# Patient Record
Sex: Male | Born: 1955
Health system: Southern US, Community
[De-identification: ages and names within clinical notes are randomized; demographics above are authoritative.]

## PROBLEM LIST (undated history)

## (undated) ENCOUNTER — Emergency Department (HOSPITAL_COMMUNITY): Discharge status: Absent without leave | Payer: Self-pay

## (undated) DIAGNOSIS — M1990 Unspecified osteoarthritis, unspecified site: Secondary | ICD-10-CM

## (undated) DIAGNOSIS — M2550 Pain in unspecified joint: Secondary | ICD-10-CM

## (undated) HISTORY — PX: JOINT REPLACEMENT: SHX530

---

## 2009-06-23 ENCOUNTER — Emergency Department (HOSPITAL_COMMUNITY): Admission: EM | Admit: 2009-06-23 | Discharge: 2009-06-23 | Payer: Self-pay | Admitting: Emergency Medicine

## 2009-06-25 ENCOUNTER — Emergency Department (HOSPITAL_COMMUNITY): Admission: EM | Admit: 2009-06-25 | Discharge: 2009-06-25 | Payer: Self-pay | Admitting: Emergency Medicine

## 2014-09-21 ENCOUNTER — Emergency Department (HOSPITAL_COMMUNITY)
Admission: EM | Admit: 2014-09-21 | Discharge: 2014-09-22 | Disposition: A | Discharge status: Home / Self Care | Payer: Managed Care, Other (non HMO) | Attending: Emergency Medicine | Admitting: Emergency Medicine

## 2014-09-21 ENCOUNTER — Emergency Department (HOSPITAL_COMMUNITY): Payer: Managed Care, Other (non HMO)

## 2014-09-21 ENCOUNTER — Encounter (HOSPITAL_COMMUNITY): Payer: Self-pay | Admitting: Emergency Medicine

## 2014-09-21 DIAGNOSIS — W1839XA Other fall on same level, initial encounter: Secondary | ICD-10-CM | POA: Diagnosis not present

## 2014-09-21 DIAGNOSIS — M87052 Idiopathic aseptic necrosis of left femur: Secondary | ICD-10-CM | POA: Insufficient documentation

## 2014-09-21 DIAGNOSIS — Y92009 Unspecified place in unspecified non-institutional (private) residence as the place of occurrence of the external cause: Secondary | ICD-10-CM | POA: Insufficient documentation

## 2014-09-21 DIAGNOSIS — S79912A Unspecified injury of left hip, initial encounter: Secondary | ICD-10-CM | POA: Diagnosis present

## 2014-09-21 DIAGNOSIS — Y999 Unspecified external cause status: Secondary | ICD-10-CM | POA: Diagnosis not present

## 2014-09-21 DIAGNOSIS — Z720 Tobacco use: Secondary | ICD-10-CM | POA: Diagnosis not present

## 2014-09-21 DIAGNOSIS — Y939 Activity, unspecified: Secondary | ICD-10-CM | POA: Insufficient documentation

## 2014-09-21 DIAGNOSIS — W19XXXA Unspecified fall, initial encounter: Secondary | ICD-10-CM

## 2014-09-21 DIAGNOSIS — S72052A Unspecified fracture of head of left femur, initial encounter for closed fracture: Secondary | ICD-10-CM | POA: Diagnosis not present

## 2014-09-21 DIAGNOSIS — R52 Pain, unspecified: Secondary | ICD-10-CM

## 2014-09-21 LAB — BASIC METABOLIC PANEL
Anion gap: 14 (ref 5–15)
BUN: 8 mg/dL (ref 6–20)
CALCIUM: 8.9 mg/dL (ref 8.9–10.3)
CO2: 23 mmol/L (ref 22–32)
Chloride: 100 mmol/L — ABNORMAL LOW (ref 101–111)
Creatinine, Ser: 1.03 mg/dL (ref 0.61–1.24)
GFR calc Af Amer: >60 mL/min (ref >60)
GFR calc non Af Amer: >60 mL/min (ref >60)
GLUCOSE: 103 mg/dL — AB (ref 70–99)
Potassium: 4.2 mmol/L (ref 3.5–5.1)
Sodium: 137 mmol/L (ref 135–145)

## 2014-09-21 LAB — CBC WITH DIFFERENTIAL/PLATELET
BASOS ABS: 0 10*3/uL (ref 0.0–0.1)
Basophils Relative: 0 % (ref 0–1)
EOS PCT: 2 % (ref 0–5)
Eosinophils Absolute: 0.1 10*3/uL (ref 0.0–0.7)
HEMATOCRIT: 42.5 % (ref 39.0–52.0)
Hemoglobin: 14.7 g/dL (ref 13.0–17.0)
LYMPHS PCT: 62 % — AB (ref 12–46)
Lymphs Abs: 3.3 10*3/uL (ref 0.7–4.0)
MCH: 33.4 pg (ref 26.0–34.0)
MCHC: 34.6 g/dL (ref 30.0–36.0)
MCV: 96.6 fL (ref 78.0–100.0)
MONO ABS: 0.6 10*3/uL (ref 0.1–1.0)
Monocytes Relative: 11 % (ref 3–12)
Neutro Abs: 1.4 10*3/uL — ABNORMAL LOW (ref 1.7–7.7)
Neutrophils Relative %: 25 % — ABNORMAL LOW (ref 43–77)
Platelets: 275 10*3/uL (ref 150–400)
RBC: 4.4 MIL/uL (ref 4.22–5.81)
RDW: 13.2 % (ref 11.5–15.5)
WBC: 5.4 10*3/uL (ref 4.0–10.5)

## 2014-09-21 MED ORDER — OXYCODONE-ACETAMINOPHEN 5-325 MG PO TABS: 1.0000 | ORAL_TABLET | Freq: Four times a day (QID) | ORAL | Status: DC | PRN

## 2014-09-21 NOTE — ED Provider Notes (Signed)
Medical screening examination/treatment/procedure(s) were conducted as a shared visit with non-physician practitioner(s) and myself.  I personally evaluated the patient during the encounter.   EKG Interpretation None      No results found for this or any previous visit. Ct Hip Left Wo Contrast  09/21/2014   CLINICAL DATA:  Patient fell at home.  Left hip pain.  EXAM: CT OF THE LEFT HIP WITHOUT CONTRAST  TECHNIQUE: Multidetector CT imaging of the left hip was performed according to the standard protocol. Multiplanar CT image reconstructions were also generated.  COMPARISON:  None.  FINDINGS: There is geographic sclerosis and lucency along the superior and lateral aspect of the left femoral head with cortical depression demonstrated. The appearance is most consistent with avascular necrosis and small associated impaction fracture. Mild degenerative narrowing of the superior acetabular joint with small osteophytes on the acetabular and femoral surfaces. No acute displaced fractures are identified. Visualized portion of the left hemipelvis is intact. No significant effusion. No soft tissue hematomas. Vascular calcifications. Prostate gland appears enlarged.  IMPRESSION: Described abnormalities in the left femoral head most consistent with avascular necrosis and associated impaction fracture.   Electronically Signed   By: Lucienne Capers M.D.   On: 09/21/2014 22:21    Patient seen by me in fast track. Patient with leg giving out left hip last evening with a fall no other injuries. CT showing avascular necrosis probably pre-existing and now a left femoral head impaction fracture. We'll discuss with orthopedics will most likely require admission. Admission the meds will be done. Also usually admitted by internal medicine. Patient the able to ambulate some has pain in left hip. No swelling to the leg no numbness to the foot on the left side.  Fredia Sorrow, MD 09/21/14 2241

## 2014-09-21 NOTE — ED Notes (Signed)
Pt. fell at home at last " legs gave out" , reports pain at left hip , ambulatory , pain increases with movement/ certain positions.

## 2014-09-21 NOTE — ED Provider Notes (Signed)
CSN: [id number]     Arrival date & time 09/21/14  2026 History  This chart was scribed for non-physician practitioner, [name], NP, working with Seth Sorrow, MD, by Jeanell Sparrow, ED Scribe. This patient was seen in room TR06C/TR06C and the patient's care was started at 9:09 PM.   Chief Complaint  Patient presents with  . Fall  . Hip Pain   Patient is a 59 y.o. male presenting with fall and hip pain. The history is provided by the patient. No language interpreter was used.  Fall This is a new problem. The current episode started 3 to 5 hours ago. The problem occurs rarely. The problem has not changed since onset.The symptoms are aggravated by walking. Nothing relieves the symptoms. He has tried nothing for the symptoms.  Hip Pain   HPI Comments: Seth Bell is a 59 y.o. male who presents to the Emergency Department complaining of a fall that occurred today. He reports that his LE gave out today and he ended up landing on his knee. He denies any LOC. He states that he had a sudden onset of left hip during and after the fall. He reports that bearing weight on his LE exacerbates the hip pain. He states that he injured his hip 6 months ago when he fell in the bath tub.   History reviewed. No pertinent past medical history. History reviewed. No pertinent past surgical history. No family history on file. History  Substance Use Topics  . Smoking status: Current Every Day Smoker  . Smokeless tobacco: Not on file  . Alcohol Use: Yes    Review of Systems  Musculoskeletal: Positive for myalgias.  Neurological: Negative for syncope.  All other systems reviewed and are negative.    Allergies  Review of patient's allergies indicates no known allergies.  Home Medications   Prior to Admission medications   Not on File   BP 122/80 mmHg  Pulse 91  Temp(Src) 98 F (36.7 C) (Oral)  Resp 12  Ht '5\' 11"'$  (1.803 m)  Wt 150 lb (68.04 kg)  BMI 20.93 kg/m2  SpO2 98% Physical Exam   Constitutional: He appears well-developed and well-nourished. No distress.  HENT:  Head: Normocephalic and atraumatic.  Neck: Neck supple. No tracheal deviation present.  Cardiovascular: Normal rate, regular rhythm and normal heart sounds.  Exam reveals no gallop and no friction rub.   No murmur heard. Pulmonary/Chest: Effort normal. No respiratory distress. He has no wheezes. He has no rales.  Musculoskeletal:  TTP along left hip in the vicinity of the femoral head. Remote hx of a fall.   Nursing note and vitals reviewed.   ED Course  Procedures (including critical care time) DIAGNOSTIC STUDIES: Oxygen Saturation is 98% on RA, normal by my interpretation.    COORDINATION OF CARE: 9:13 PM- Pt advised of plan for treatment which includes radiology and pt agrees.  Labs Review Labs Reviewed - No data to display  Imaging Review Ct Hip Left Wo Contrast  09/21/2014   CLINICAL DATA:  Patient fell at home.  Left hip pain.  EXAM: CT OF THE LEFT HIP WITHOUT CONTRAST  TECHNIQUE: Multidetector CT imaging of the left hip was performed according to the standard protocol. Multiplanar CT image reconstructions were also generated.  COMPARISON:  None.  FINDINGS: There is geographic sclerosis and lucency along the superior and lateral aspect of the left femoral head with cortical depression demonstrated. The appearance is most consistent with avascular necrosis and small associated impaction fracture. Mild  degenerative narrowing of the superior acetabular joint with small osteophytes on the acetabular and femoral surfaces. No acute displaced fractures are identified. Visualized portion of the left hemipelvis is intact. No significant effusion. No soft tissue hematomas. Vascular calcifications. Prostate gland appears enlarged.  IMPRESSION: Described abnormalities in the left femoral head most consistent with avascular necrosis and associated impaction fracture.   Electronically Signed   By: Lucienne Capers  M.D.   On: 09/21/2014 22:21     EKG Interpretation None     Discussed patient with Dr. Percell Miller, orthopedics--he has reviewed the CT scan, and recommends discharging the patient home with touch-down weight bearing.  He will see the patient in the office on Wednesday morning to evaluate the patient and discuss options for care.  Patient agreeable with plan.  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   I personally performed the services described in this documentation, which was scribed in my presence. The recorded information has been reviewed and is accurate.     [name], NP 09/22/14 0140

## 2014-09-21 NOTE — Discharge Instructions (Signed)
Avascular Necrosis Avascular necrosis is a disease resulting from the temporary or permanent loss of the blood supply to the bones. Without blood, the bone tissue dies and causes the bone to become soft. If the process involves the bone near a joint, it may lead to collapse of the joint surface. This disease is also known as:  Osteonecrosis.  Aseptic necrosis.  Ischemic bone necrosis. Avascular necrosis most commonly affects the ends (epiphysis) of long bones. The femur, the bone extending from the knee joint to the hip joint, is the bone most commonly involved. The disease may affect 1 bone, more than 1 bone at the same time, more than 1 bone at different times. It affects men and women equally. Avascular necrosis occurs at any age. But it is more common between the ages of 51 and 10 years. SYMPTOMS  In early stages patients may not have any symptoms. But as the disease progresses, joint pain generally develops. At first there is pain when putting weight on the affected joint, and then when resting. Pain usually develops gradually. It may be mild or severe. As the disease progresses and the bone and surrounding joint surface collapses, pain may develop or increase dramatically. Pain may be severe enough to limit range of motion in the affected joint. The period of time between the first symptoms and loss of joint function is different for each patient. This can range from several months to more than a year. Disability depends on: 1. What part of the bone is affected. 2. How large an area is involved. 3. How effectively the bone repairs itself. 4. If other illnesses are present. 5. If you are being treated for cancer with medications (chemotherapy). 6. Radiation. 7. The cause of the avascular necrosis. DIAGNOSIS  The diagnosis of aseptic necrosis is usually made by: 1. Taking a history. 2. Doing an exam. 3. Taking X-rays. (If X-rays are normal, an MRI may be required.) 4. Sometimes further  blood work and specialized studies may be necessary. TREATMENT  Treatment for this disease is necessary to maintain joint function. If untreated, most patients will suffer severe pain and limitation in movement within 2 years. Several treatments are available that help prevent further bone and joint damage. They can also reduce pain. To determine the most appropriate treatment, the caregiver considers the following aspects of a patient's disease: 1. The age of the patient. 2. The stage of the disease (early or late). 3. The location and amount of bone affected. It may be a small or large area. 4. The underlying cause of avascular necrosis. The goals in treatment are to: 1. Improve the patient's use of the affected joint. 2. Stop further damage to the bone. 3. Improve bone and joint survival. Your caregiver may use one or more of the following treatments: 1. Reduced weight bearing. If avascular necrosis is diagnosed early, the caregiver may begin treatment by having the patient limit weight on the affected joint. The caregiver may recommend limiting activities or using crutches. In some cases, reduced weight bearing can slow the damage caused by the disease and permit natural healing. When combined with medication to reduce pain, reduced weight bearing can be an effective way to avoid or delay surgery for some patients. Most patients eventually will need surgery to reconstruct the joint. 2. Core decompression. Core decompression works best in people who are in the earliest stages of avascular necrosis, before the collapse of the joint. This procedure often can reduce pain and slow the progression  of bone and joint destruction in these patients. This surgical procedure removes the inner layer of bone, which: 1. Reduces pressure within the bone. 2. Increases blood flow to the bone. 3. Allows more blood vessels to form. 4. Reduces pain. 3. Osteotomy. This surgical procedure re-shapes the bone to reduce  stress on the affected area of the joint. There is a lengthy recovery period. The patient's activities are very limited for 3 to 12 months after an osteotomy. This procedure is most effective for younger patients with advanced avascular necrosis, and those with a large area of affected bone. 4. Bone Graft. A bone graft may be used to support a joint after core decompression. Bone grafting is surgery that transplants healthy bone from one part of the patient, such as the leg, to the diseased area. Sometimes the bone is taken with it's blood vessels which are attached to local blood vessels near the area of bone collapse. This is called a vascularized bone graft. There is a lengthy recovery period after a bone graft, usually from 6 to 12 months. This procedure is technically complex. 5. Arthroplasty. Arthroplasty is also known as total joint replacement. Total joint replacement is used in late-stage avascular necrosis, and when the joint is deformed. In this surgery, the diseased joint is replaced with artificial parts. It may be recommended for people who are not good candidates for other treatments, such as patients who may not do well with repeated attempts to preserve the joint. Various types of replacements are available, and patients should discuss specific needs with their caregiver. New treatments being tried include: 1. The use of medications. 2. Electrical stimulation. 3. Combination therapies to increase the growth of new bone and blood vessels. Document Released: 10/21/2001 Document Revised: 07/24/2011 Document Reviewed: 07/09/2013 [facility] Patient Information 2015 [place], [place]. This information is not intended to replace advice given to you by your health care provider. Make sure you discuss any questions you have with your health care provider. Crutch Use Crutches take weight off one of your legs or feet when you stand or walk. It is important to use crutches that fit right. Your crutches  fit right if:  You can fit 2-3 fingers between your armpit and the crutch.  You use your hands, not your armpits, to hold yourself up. Do not put your armpits on the crutches. This can damage the nerves in your hands and arms. Crutches should be a little below your armpits. HOW TO USE YOUR CRUTCHES Walking 8. Step with the crutches. 9. Swing the good leg a little bit in front of the crutches. Going Up Steps If there is no handrail: 5. Step up with the good leg. 6. Step up with the crutches and hurt leg. 7. Continue in this way. If there is a handrail: 5. Hold both crutches in one hand. 6. Place your free hand on the handrail. 7. Put your weight on your arms and lift your good leg to the step. 8. Bring the crutches and the hurt leg up to that step. 9. Continue in this way. Going Down Steps Be very careful, as going down stairs with crutches is very challenging. If there is no handrail: 4. Step down with the hurt leg and crutches. 5. Step down with the good leg. If there is a handrail: 6. Place your hand on the handrail. 7. Hold both crutches with your free hand. 8. Lower your hurt leg and crutch to the step below you. Make sure to keep the crutch  tips in the center of the step, never on the edge. 9. Lower your good leg to that step. 10. Continue in this way. Standing Up 4. Hold the hurt leg forward. 5. Grab the armrest with one hand and the top of the crutches with the other hand. 6. Pull yourself up to a standing position. Sitting Down 1. Hold the hurt leg forward. 2. Grab the armrest with one hand and the top of the crutches with the other hand. 3.  Lower yourself to a sitting position. GET HELP IF:  You still feel wobbly on your feet.  You develop new pain, for example in your armpits, back, shoulder, wrist, or hip.  You cannot feel a part of your body (numb).  You have tingling. GET HELP RIGHT AWAY IF: You fall. Document Released: 10/18/2007 Document Revised:  02/19/2013 Document Reviewed: 01/06/2013 [facility] Patient Information 2015 [place], [place]. This information is not intended to replace advice given to you by your health care provider. Make sure you discuss any questions you have with your health care provider.

## 2014-09-21 NOTE — Consult Note (Signed)
I have reviewed his CT and history. These findings are consistent with subacute colapse of his AVN. He will likely need a THA, but this is safer set up as an outpatient with medical optimization.  Plan: Crutchest/walker TDWB LLE NSAIDs PRN F/u with me first thing Wednesday morning for eval in the office. (8:30am)    Seth Bell D

## 2014-10-05 NOTE — H&P (Signed)
TOTAL HIP ADMISSION H&P  Patient is admitted for left total hip arthroplasty.  Subjective:  Chief Complaint: left hip pain  HPI: Seth Bell, 59 y.o. male, has a history of pain and functional disability in the left hip(s) due to arthritis and patient has failed non-surgical conservative treatments for greater than 12 weeks to include NSAID's and/or analgesics, use of assistive devices and activity modification.  Onset of symptoms was gradual starting 6 years ago with gradually worsening course since that time.The patient noted no past surgery on the left hip(s).  Patient currently rates pain in the left hip at 8 out of 10 with activity. Patient has night pain, worsening of pain with activity and weight bearing and pain that interfers with activities of daily living. Patient has evidence of joint space narrowing by imaging studies. This condition presents safety issues increasing the risk of falls.  There is no current active infection.  There are no active problems to display for this patient.  No past medical history on file.  No past surgical history on file.  No prescriptions prior to admission   No Known Allergies  History  Substance Use Topics  . Smoking status: Current Every Day Smoker  . Smokeless tobacco: Not on file  . Alcohol Use: Yes    No family history on file.   Review of Systems  Constitutional: Negative for fever and chills.  HENT: Negative for congestion.   Eyes: Negative for blurred vision and pain.  Respiratory: Negative for cough and shortness of breath.   Cardiovascular: Negative for chest pain and palpitations.  Gastrointestinal: Negative for nausea, vomiting and abdominal pain.  Musculoskeletal:       Left hip pain with ambulation  Skin: Negative for rash.  Neurological: Negative for dizziness, seizures and headaches.  Psychiatric/Behavioral: Negative for depression and suicidal ideas.    Objective:  Physical Exam  Constitutional: He is oriented to  person, place, and time. He appears well-developed and well-nourished.  HENT:  Head: Normocephalic and atraumatic.  Eyes: Conjunctivae and EOM are normal. Pupils are equal, round, and reactive to light.  Neck: Normal range of motion. Neck supple.  Cardiovascular: Normal rate and intact distal pulses.   Respiratory: Effort normal and breath sounds normal.  GI: Soft. Bowel sounds are normal.  Musculoskeletal:  Left hip has decreased ROM due to pain  Neurological: He is alert and oriented to person, place, and time.  Skin: Skin is warm and dry.  Psychiatric: He has a normal mood and affect. His behavior is normal. Judgment and thought content normal.    Vital signs in last 24 hours:    Labs:   Estimated body mass index is 20.93 kg/(m^2) as calculated from the following:   Height as of 09/21/14: '5\' 11"'$  (1.803 m).   Weight as of 09/21/14: 68.04 kg (150 lb).   Imaging Review Plain radiographs demonstrate severe degenerative joint disease of the left hip(s). The bone quality appears to be fair for age and reported activity level.  Assessment/Plan:  End stage arthritis, left hip(s)  The patient history, physical examination, clinical judgement of the provider and imaging studies are consistent with end stage degenerative joint disease of the left hip(s) and total hip arthroplasty is deemed medically necessary. The treatment options including medical management, injection therapy, arthroscopy and arthroplasty were discussed at length. The risks and benefits of total hip arthroplasty were presented and reviewed. The risks due to aseptic loosening, infection, stiffness, dislocation/subluxation,  thromboembolic complications and other imponderables were discussed.  The patient acknowledged the explanation, agreed to proceed with the plan and consent was signed. Patient is being admitted for inpatient treatment for surgery, pain control, PT, OT, prophylactic antibiotics, VTE prophylaxis, progressive  ambulation and ADL's and discharge planning.The patient is planning to be discharged home with home health services

## 2014-10-07 ENCOUNTER — Encounter (HOSPITAL_COMMUNITY)
Admission: RE | Admit: 2014-10-07 | Discharge: 2014-10-07 | Disposition: A | Discharge status: Home / Self Care | Payer: Managed Care, Other (non HMO) | Source: Ambulatory Visit | Attending: Orthopedic Surgery | Admitting: Orthopedic Surgery

## 2014-10-07 ENCOUNTER — Encounter (HOSPITAL_COMMUNITY): Payer: Self-pay

## 2014-10-07 DIAGNOSIS — Z539 Procedure and treatment not carried out, unspecified reason: Secondary | ICD-10-CM | POA: Insufficient documentation

## 2014-10-07 DIAGNOSIS — Z01812 Encounter for preprocedural laboratory examination: Secondary | ICD-10-CM | POA: Diagnosis present

## 2014-10-07 HISTORY — DX: Unspecified osteoarthritis, unspecified site: M19.90

## 2014-10-07 HISTORY — DX: Pain in unspecified joint: M25.50

## 2014-10-07 LAB — SURGICAL PCR SCREEN
MRSA, PCR: NEGATIVE
STAPHYLOCOCCUS AUREUS: NEGATIVE

## 2014-10-07 LAB — CBC
HCT: 42.6 % (ref 39.0–52.0)
HEMOGLOBIN: 14.5 g/dL (ref 13.0–17.0)
MCH: 33 pg (ref 26.0–34.0)
MCHC: 34 g/dL (ref 30.0–36.0)
MCV: 97 fL (ref 78.0–100.0)
PLATELETS: 341 10*3/uL (ref 150–400)
RBC: 4.39 MIL/uL (ref 4.22–5.81)
RDW: 13.3 % (ref 11.5–15.5)
WBC: 4.2 10*3/uL (ref 4.0–10.5)

## 2014-10-07 LAB — BASIC METABOLIC PANEL
ANION GAP: 13 (ref 5–15)
BUN: 8 mg/dL (ref 6–20)
CHLORIDE: 99 mmol/L — AB (ref 101–111)
CO2: 25 mmol/L (ref 22–32)
Calcium: 9.4 mg/dL (ref 8.9–10.3)
Creatinine, Ser: 0.94 mg/dL (ref 0.61–1.24)
GFR calc non Af Amer: >60 mL/min (ref >60)
Glucose, Bld: 88 mg/dL (ref 65–99)
Potassium: 4.7 mmol/L (ref 3.5–5.1)
SODIUM: 137 mmol/L (ref 135–145)

## 2014-10-07 LAB — ABO/RH: ABO/RH(D): A POS

## 2014-10-07 LAB — TYPE AND SCREEN
ABO/RH(D): A POS
Antibody Screen: NEGATIVE

## 2014-10-07 MED ORDER — CHLORHEXIDINE GLUCONATE 4 % EX LIQD: 60.0000 mL | Freq: Once | CUTANEOUS | Status: DC

## 2014-10-07 NOTE — Progress Notes (Signed)
Pt doesn't have a cardiologist  Denies ever having an echo/stress test/heart cath  Dr.Husain is Medical Md  Denies EKG or CXR in past yr

## 2014-10-07 NOTE — Pre-Procedure Instructions (Signed)
Seth Bell  10/07/2014      [facility], [place] - [place] [address] Phone: [phone] Fax: [phone]    Your procedure is scheduled on Tues, June 7 @ 10:15 AM  Report to [facility]  at  8:15 AM  Call this number if you have problems the morning of surgery:  [phone]   Remember:  Do not eat food or drink liquids after midnight.  Take these medicines the morning of surgery with A SIP OF WATER Pain Pill(if needed)               No Goody's,BC's,Aleve,Aspirin,Ibuprofen,Fish Oil,or any Herbal Medications.    Do not wear jewelry.  Do not wear lotions, powders, or colognes.  You may wear deodorant.  Men may shave face and neck.  Do not bring valuables to the hospital.  [facility] is not responsible for any belongings or valuables.  Contacts, dentures or bridgework may not be worn into surgery.  Leave your suitcase in the car.  After surgery it may be brought to your room.  For patients admitted to the hospital, discharge time will be determined by your treatment team.  Patients discharged the day of surgery will not be allowed to drive home.    Special instructions:  [facility] - Preparing for Surgery  Before surgery, you can play an important role.  Because skin is not sterile, your skin needs to be as free of germs as possible.  You can reduce the number of germs on you skin by washing with CHG (chlorahexidine gluconate) soap before surgery.  CHG is an antiseptic cleaner which kills germs and bonds with the skin to continue killing germs even after washing.  Please DO NOT use if you have an allergy to CHG or antibacterial soaps.  If your skin becomes reddened/irritated stop using the CHG and inform your nurse when you arrive at Short Stay.  Do not shave (including legs and underarms) for at least 48 hours prior to the first CHG shower.  You may shave your face.  Please  follow these instructions carefully:   1.  Shower with CHG Soap the night before surgery and the                                morning of Surgery.  2.  If you choose to wash your hair, wash your hair first as usual with your       normal shampoo.  3.  After you shampoo, rinse your hair and body thoroughly to remove the                      Shampoo.  4.  Use CHG as you would any other liquid soap.  You can apply chg directly       to the skin and wash gently with scrungie or a clean washcloth.  5.  Apply the CHG Soap to your body ONLY FROM THE NECK DOWN.        Do not use on open wounds or open sores.  Avoid contact with your eyes,       ears, mouth and genitals (private parts).  Wash genitals (private parts)       with your normal soap.  6.  Wash thoroughly, paying special attention to the area where your surgery  will be performed.  7.  Thoroughly rinse your body with warm water from the neck down.  8.  DO NOT shower/wash with your normal soap after using and rinsing off       the CHG Soap.  9.  Pat yourself dry with a clean towel.            10.  Wear clean pajamas.            11.  Place clean sheets on your bed the night of your first shower and do not        sleep with pets.  Day of Surgery  Do not apply any lotions/deoderants the morning of surgery.  Please wear clean clothes to the hospital/surgery center.    Please read over the following fact sheets that you were given. Pain Booklet, Coughing and Deep Breathing, Blood Transfusion Information, MRSA Information and Surgical Site Infection Prevention

## 2014-10-20 ENCOUNTER — Inpatient Hospital Stay (HOSPITAL_COMMUNITY)
Admission: RE | Admit: 2014-10-20 | Payer: Managed Care, Other (non HMO) | Source: Ambulatory Visit | Admitting: Orthopedic Surgery

## 2014-10-20 ENCOUNTER — Encounter (HOSPITAL_COMMUNITY): Admission: RE | Payer: Self-pay | Source: Ambulatory Visit

## 2014-10-20 SURGERY — ARTHROPLASTY, HIP, TOTAL, ANTERIOR APPROACH
Anesthesia: General | Laterality: Left

## 2014-10-21 ENCOUNTER — Other Ambulatory Visit (HOSPITAL_COMMUNITY): Payer: Self-pay | Admitting: *Deleted

## 2014-10-21 NOTE — Pre-Procedure Instructions (Signed)
Damauri Minion  10/21/2014      Your procedure is scheduled on Tuesday, October 27, 2014 at 10:00 AM.   Report to [facility] Entrance "A" Admitting Office at 8:00 AM.   Call this number if you have problems the morning of surgery: [phone]   Any questions prior to day of surgery, please call [phone] between 8 & 4 PM.    Remember:  Do not eat food or drink liquids after midnight Monday, 10/26/14.  Take these medicines the morning of surgery with A SIP OF WATER : None   Do not wear jewelry.  Do not wear lotions, powders, or cologne.  You may wear deodorant.  Men may shave face and neck.  Do not bring valuables to the hospital.  [facility] is not responsible for any belongings or valuables.  Contacts, dentures or bridgework may not be worn into surgery.  Leave your suitcase in the car.  After surgery it may be brought to your room.  For patients admitted to the hospital, discharge time will be determined by your treatment team.  Special instructions:  [place] - Preparing for Surgery  Before surgery, you can play an important role.  Because skin is not sterile, your skin needs to be as free of germs as possible.  You can reduce the number of germs on you skin by washing with CHG (chlorahexidine gluconate) soap before surgery.  CHG is an antiseptic cleaner which kills germs and bonds with the skin to continue killing germs even after washing.  Please DO NOT use if you have an allergy to CHG or antibacterial soaps.  If your skin becomes reddened/irritated stop using the CHG and inform your nurse when you arrive at Short Stay.  Do not shave (including legs and underarms) for at least 48 hours prior to the first CHG shower.  You may shave your face.  Please follow these instructions carefully:   1.  Shower with CHG Soap the night before surgery and the                                morning of Surgery.  2.  If you choose to wash your hair, wash your hair first as  usual with your       normal shampoo.  3.  After you shampoo, rinse your hair and body thoroughly to remove the                      Shampoo.  4.  Use CHG as you would any other liquid soap.  You can apply chg directly       to the skin and wash gently with scrungie or a clean washcloth.  5.  Apply the CHG Soap to your body ONLY FROM THE NECK DOWN.        Do not use on open wounds or open sores.  Avoid contact with your eyes, ears, mouth and genitals (private parts).  Wash genitals (private parts) with your normal soap.  6.  Wash thoroughly, paying special attention to the area where your surgery        will be performed.  7.  Thoroughly rinse your body with warm water from the neck down.  8.  DO NOT shower/wash with your normal soap after using and rinsing off       the CHG Soap.  9.  Pat yourself dry with  a clean towel.            10.  Wear clean pajamas.            11.  Place clean sheets on your bed the night of your first shower and do not        sleep with pets.  Day of Surgery  Do not apply any lotions the morning of surgery.  Please wear clean clothes to the hospital.    Please read over the following fact sheets that you were given. Pain Booklet, Coughing and Deep Breathing, Blood Transfusion Information, MRSA Information and Surgical Site Infection Prevention

## 2014-10-22 ENCOUNTER — Encounter (HOSPITAL_COMMUNITY)
Admission: RE | Admit: 2014-10-22 | Discharge: 2014-10-22 | Disposition: A | Discharge status: Home / Self Care | Payer: Managed Care, Other (non HMO) | Source: Ambulatory Visit | Attending: Orthopedic Surgery | Admitting: Orthopedic Surgery

## 2014-10-22 ENCOUNTER — Encounter (HOSPITAL_COMMUNITY): Payer: Self-pay

## 2014-10-22 DIAGNOSIS — M1612 Unilateral primary osteoarthritis, left hip: Secondary | ICD-10-CM | POA: Diagnosis not present

## 2014-10-22 DIAGNOSIS — Z0183 Encounter for blood typing: Secondary | ICD-10-CM | POA: Insufficient documentation

## 2014-10-22 DIAGNOSIS — Z01812 Encounter for preprocedural laboratory examination: Secondary | ICD-10-CM | POA: Diagnosis present

## 2014-10-22 LAB — BASIC METABOLIC PANEL
Anion gap: 8 (ref 5–15)
BUN: 6 mg/dL (ref 6–20)
CO2: 26 mmol/L (ref 22–32)
Calcium: 8.9 mg/dL (ref 8.9–10.3)
Chloride: 103 mmol/L (ref 101–111)
Creatinine, Ser: 0.88 mg/dL (ref 0.61–1.24)
GFR calc Af Amer: >60 mL/min (ref >60)
GFR calc non Af Amer: >60 mL/min (ref >60)
Glucose, Bld: 100 mg/dL — ABNORMAL HIGH (ref 65–99)
Potassium: 4.3 mmol/L (ref 3.5–5.1)
SODIUM: 137 mmol/L (ref 135–145)

## 2014-10-22 LAB — CBC
HEMATOCRIT: 40.2 % (ref 39.0–52.0)
Hemoglobin: 14.2 g/dL (ref 13.0–17.0)
MCH: 33.8 pg (ref 26.0–34.0)
MCHC: 35.3 g/dL (ref 30.0–36.0)
MCV: 95.7 fL (ref 78.0–100.0)
PLATELETS: 294 10*3/uL (ref 150–400)
RBC: 4.2 MIL/uL — AB (ref 4.22–5.81)
RDW: 12.8 % (ref 11.5–15.5)
WBC: 3.4 10*3/uL — ABNORMAL LOW (ref 4.0–10.5)

## 2014-10-22 LAB — TYPE AND SCREEN
ABO/RH(D): A POS
Antibody Screen: NEGATIVE

## 2014-10-22 LAB — PROTIME-INR
INR: 1.14 (ref 0.00–1.49)
Prothrombin Time: 14.8 seconds (ref 11.6–15.2)

## 2014-10-22 NOTE — Progress Notes (Signed)
Pt denies any recent chest pain or sob. Pt was seen here 10/07/14 for PAT, but surgery was cancelled. Did not repeat PCR, negative on 10/07/14.

## 2014-10-23 LAB — URINE CULTURE
COLONY COUNT: NO GROWTH
Culture: NO GROWTH

## 2014-10-26 MED ORDER — TRANEXAMIC ACID 1000 MG/10ML IV SOLN
2000.0000 mg | INTRAVENOUS | Status: DC
  Filled 2014-10-26: qty 20

## 2014-10-26 NOTE — Progress Notes (Signed)
Patient notified to arrive at 13:15

## 2014-10-27 ENCOUNTER — Encounter (HOSPITAL_COMMUNITY)
Admission: RE | Disposition: A | Discharge status: Home Health | Payer: Self-pay | Source: Ambulatory Visit | Attending: Orthopedic Surgery

## 2014-10-27 ENCOUNTER — Encounter (HOSPITAL_COMMUNITY): Payer: Self-pay | Admitting: General Practice

## 2014-10-27 ENCOUNTER — Inpatient Hospital Stay (HOSPITAL_COMMUNITY): Payer: Managed Care, Other (non HMO) | Admitting: Anesthesiology

## 2014-10-27 ENCOUNTER — Inpatient Hospital Stay (HOSPITAL_COMMUNITY): Payer: Managed Care, Other (non HMO)

## 2014-10-27 ENCOUNTER — Inpatient Hospital Stay (HOSPITAL_COMMUNITY)
Admission: RE | Admit: 2014-10-27 | Discharge: 2014-10-28 | DRG: 470 | Disposition: A | Discharge status: Home Health | Payer: Managed Care, Other (non HMO) | Source: Ambulatory Visit | Attending: Orthopedic Surgery | Admitting: Orthopedic Surgery

## 2014-10-27 DIAGNOSIS — F17210 Nicotine dependence, cigarettes, uncomplicated: Secondary | ICD-10-CM | POA: Diagnosis present

## 2014-10-27 DIAGNOSIS — M1612 Unilateral primary osteoarthritis, left hip: Principal | ICD-10-CM | POA: Diagnosis present

## 2014-10-27 DIAGNOSIS — M25552 Pain in left hip: Secondary | ICD-10-CM | POA: Diagnosis present

## 2014-10-27 DIAGNOSIS — Z96649 Presence of unspecified artificial hip joint: Secondary | ICD-10-CM

## 2014-10-27 DIAGNOSIS — M1990 Unspecified osteoarthritis, unspecified site: Secondary | ICD-10-CM | POA: Diagnosis present

## 2014-10-27 HISTORY — PX: TOTAL HIP ARTHROPLASTY: SHX124

## 2014-10-27 SURGERY — ARTHROPLASTY, HIP, TOTAL, ANTERIOR APPROACH
Anesthesia: General | Site: Hip | Laterality: Left

## 2014-10-27 MED ORDER — PROPOFOL 10 MG/ML IV BOLUS
INTRAVENOUS | Status: AC
  Filled 2014-10-27: qty 20

## 2014-10-27 MED ORDER — ONDANSETRON HCL 4 MG/2ML IJ SOLN: 4.0000 mg | Freq: Four times a day (QID) | INTRAMUSCULAR | Status: DC | PRN

## 2014-10-27 MED ORDER — NEOSTIGMINE METHYLSULFATE 10 MG/10ML IV SOLN
INTRAVENOUS | Status: AC
  Filled 2014-10-27: qty 1

## 2014-10-27 MED ORDER — CELECOXIB 200 MG PO CAPS
200.0000 mg | ORAL_CAPSULE | Freq: Two times a day (BID) | ORAL | Status: DC
  Administered 2014-10-27 – 2014-10-28 (×2): 200 mg via ORAL
  Filled 2014-10-27 (×4): qty 1

## 2014-10-27 MED ORDER — ACETAMINOPHEN 650 MG RE SUPP: 650.0000 mg | Freq: Four times a day (QID) | RECTAL | Status: DC | PRN

## 2014-10-27 MED ORDER — DEXAMETHASONE SODIUM PHOSPHATE 10 MG/ML IJ SOLN
10.0000 mg | Freq: Once | INTRAMUSCULAR | Status: AC
  Administered 2014-10-28: 10 mg via INTRAVENOUS
  Filled 2014-10-27: qty 1

## 2014-10-27 MED ORDER — PNEUMOCOCCAL VAC POLYVALENT 25 MCG/0.5ML IJ INJ
0.5000 mL | INJECTION | INTRAMUSCULAR | Status: AC
  Administered 2014-10-28: 0.5 mL via INTRAMUSCULAR
  Filled 2014-10-27: qty 0.5

## 2014-10-27 MED ORDER — MEPERIDINE HCL 25 MG/ML IJ SOLN: 6.2500 mg | INTRAMUSCULAR | Status: DC | PRN

## 2014-10-27 MED ORDER — NEOSTIGMINE METHYLSULFATE 10 MG/10ML IV SOLN
INTRAVENOUS | Status: DC | PRN
  Administered 2014-10-27: 3 mg via INTRAVENOUS

## 2014-10-27 MED ORDER — HYDROMORPHONE HCL 1 MG/ML IJ SOLN
INTRAMUSCULAR | Status: AC
  Filled 2014-10-27: qty 1

## 2014-10-27 MED ORDER — LIDOCAINE HCL (CARDIAC) 20 MG/ML IV SOLN
INTRAVENOUS | Status: AC
  Filled 2014-10-27: qty 5

## 2014-10-27 MED ORDER — METHOCARBAMOL 1000 MG/10ML IJ SOLN
500.0000 mg | Freq: Four times a day (QID) | INTRAVENOUS | Status: DC | PRN
  Administered 2014-10-27: 500 mg via INTRAVENOUS
  Filled 2014-10-27 (×2): qty 5

## 2014-10-27 MED ORDER — ONDANSETRON HCL 4 MG/2ML IJ SOLN
INTRAMUSCULAR | Status: DC | PRN
  Administered 2014-10-27: 4 mg via INTRAVENOUS

## 2014-10-27 MED ORDER — ROCURONIUM BROMIDE 100 MG/10ML IV SOLN
INTRAVENOUS | Status: DC | PRN
  Administered 2014-10-27: 10 mg via INTRAVENOUS
  Administered 2014-10-27: 50 mg via INTRAVENOUS

## 2014-10-27 MED ORDER — HYDROMORPHONE HCL 1 MG/ML IJ SOLN
1.0000 mg | INTRAMUSCULAR | Status: DC | PRN
  Administered 2014-10-27: 1 mg via INTRAVENOUS
  Filled 2014-10-27: qty 1

## 2014-10-27 MED ORDER — ASPIRIN 325 MG PO TABS: 325.0000 mg | ORAL_TABLET | Freq: Every day | ORAL | Status: AC

## 2014-10-27 MED ORDER — HYDROMORPHONE HCL 1 MG/ML IJ SOLN
INTRAMUSCULAR | Status: AC
  Administered 2014-10-27: 0.5 mg via INTRAVENOUS
  Filled 2014-10-27: qty 1

## 2014-10-27 MED ORDER — 0.9 % SODIUM CHLORIDE (POUR BTL) OPTIME
TOPICAL | Status: DC | PRN
  Administered 2014-10-27: 1000 mL

## 2014-10-27 MED ORDER — ONDANSETRON HCL 4 MG/2ML IJ SOLN
INTRAMUSCULAR | Status: AC
  Filled 2014-10-27: qty 2

## 2014-10-27 MED ORDER — HYDROCODONE-ACETAMINOPHEN 5-325 MG PO TABS
1.0000 | ORAL_TABLET | ORAL | Status: DC | PRN
  Administered 2014-10-27 – 2014-10-28 (×3): 2 via ORAL
  Filled 2014-10-27 (×3): qty 2

## 2014-10-27 MED ORDER — TRANEXAMIC ACID 1000 MG/10ML IV SOLN
1000.0000 mg | INTRAVENOUS | Status: DC
  Filled 2014-10-27 (×2): qty 10

## 2014-10-27 MED ORDER — ROCURONIUM BROMIDE 50 MG/5ML IV SOLN
INTRAVENOUS | Status: AC
  Filled 2014-10-27: qty 1

## 2014-10-27 MED ORDER — DOCUSATE SODIUM 100 MG PO CAPS: 100.0000 mg | ORAL_CAPSULE | Freq: Two times a day (BID) | ORAL | Status: DC

## 2014-10-27 MED ORDER — TRANEXAMIC ACID 1000 MG/10ML IV SOLN
1000.0000 mg | INTRAVENOUS | Status: AC
  Administered 2014-10-27 (×2): 1000 mg via INTRAVENOUS
  Filled 2014-10-27 (×2): qty 10

## 2014-10-27 MED ORDER — METHOCARBAMOL 500 MG PO TABS: 500.0000 mg | ORAL_TABLET | Freq: Four times a day (QID) | ORAL | Status: DC

## 2014-10-27 MED ORDER — ASPIRIN EC 325 MG PO TBEC
325.0000 mg | DELAYED_RELEASE_TABLET | Freq: Every day | ORAL | Status: DC
  Administered 2014-10-28: 325 mg via ORAL
  Filled 2014-10-27: qty 1

## 2014-10-27 MED ORDER — MIDAZOLAM HCL 2 MG/2ML IJ SOLN
INTRAMUSCULAR | Status: AC
  Filled 2014-10-27: qty 2

## 2014-10-27 MED ORDER — ACETAMINOPHEN 325 MG PO TABS: 650.0000 mg | ORAL_TABLET | Freq: Four times a day (QID) | ORAL | Status: DC | PRN

## 2014-10-27 MED ORDER — HYDROMORPHONE HCL 1 MG/ML IJ SOLN
0.2500 mg | INTRAMUSCULAR | Status: DC | PRN
  Administered 2014-10-27 (×4): 0.5 mg via INTRAVENOUS

## 2014-10-27 MED ORDER — METHOCARBAMOL 500 MG PO TABS
500.0000 mg | ORAL_TABLET | Freq: Four times a day (QID) | ORAL | Status: DC | PRN
  Administered 2014-10-28: 500 mg via ORAL
  Filled 2014-10-27 (×2): qty 1

## 2014-10-27 MED ORDER — FENTANYL CITRATE (PF) 100 MCG/2ML IJ SOLN
INTRAMUSCULAR | Status: DC | PRN
  Administered 2014-10-27: 150 ug via INTRAVENOUS
  Administered 2014-10-27: 100 ug via INTRAVENOUS
  Administered 2014-10-27: 150 ug via INTRAVENOUS
  Administered 2014-10-27: 100 ug via INTRAVENOUS

## 2014-10-27 MED ORDER — MIDAZOLAM HCL 5 MG/5ML IJ SOLN
INTRAMUSCULAR | Status: DC | PRN
  Administered 2014-10-27: 2 mg via INTRAVENOUS

## 2014-10-27 MED ORDER — METOCLOPRAMIDE HCL 5 MG/ML IJ SOLN: 5.0000 mg | Freq: Three times a day (TID) | INTRAMUSCULAR | Status: DC | PRN

## 2014-10-27 MED ORDER — CEFAZOLIN SODIUM-DEXTROSE 2-3 GM-% IV SOLR
INTRAVENOUS | Status: DC | PRN
  Administered 2014-10-27: 2 g via INTRAVENOUS

## 2014-10-27 MED ORDER — LACTATED RINGERS IV SOLN
INTRAVENOUS | Status: DC
  Administered 2014-10-27: 14:00:00 via INTRAVENOUS

## 2014-10-27 MED ORDER — FENTANYL CITRATE (PF) 250 MCG/5ML IJ SOLN
INTRAMUSCULAR | Status: AC
  Filled 2014-10-27: qty 5

## 2014-10-27 MED ORDER — ONDANSETRON HCL 4 MG PO TABS: 4.0000 mg | ORAL_TABLET | Freq: Four times a day (QID) | ORAL | Status: DC | PRN

## 2014-10-27 MED ORDER — HYDROMORPHONE HCL 1 MG/ML IJ SOLN
INTRAMUSCULAR | Status: DC | PRN
Start: 2014-10-27 — End: 2014-10-27
  Administered 2014-10-27: 1 mg via INTRAVENOUS

## 2014-10-27 MED ORDER — HYDROCODONE-ACETAMINOPHEN 5-325 MG PO TABS: 1.0000 | ORAL_TABLET | Freq: Four times a day (QID) | ORAL | Status: DC | PRN

## 2014-10-27 MED ORDER — GLYCOPYRROLATE 0.2 MG/ML IJ SOLN
INTRAMUSCULAR | Status: DC | PRN
  Administered 2014-10-27: 0.4 mg via INTRAVENOUS

## 2014-10-27 MED ORDER — PROMETHAZINE HCL 25 MG/ML IJ SOLN: 6.2500 mg | INTRAMUSCULAR | Status: DC | PRN

## 2014-10-27 MED ORDER — MENTHOL 3 MG MT LOZG: 1.0000 | LOZENGE | OROMUCOSAL | Status: DC | PRN

## 2014-10-27 MED ORDER — PROPOFOL 10 MG/ML IV BOLUS
INTRAVENOUS | Status: DC | PRN
  Administered 2014-10-27: 180 mg via INTRAVENOUS

## 2014-10-27 MED ORDER — HYDROCODONE-ACETAMINOPHEN 5-325 MG PO TABS
ORAL_TABLET | ORAL | Status: AC
  Administered 2014-10-27: 2 via ORAL
  Filled 2014-10-27: qty 2

## 2014-10-27 MED ORDER — CEFAZOLIN SODIUM-DEXTROSE 2-3 GM-% IV SOLR
2.0000 g | Freq: Four times a day (QID) | INTRAVENOUS | Status: AC
  Administered 2014-10-27 – 2014-10-28 (×2): 2 g via INTRAVENOUS
  Filled 2014-10-27 (×2): qty 50

## 2014-10-27 MED ORDER — LACTATED RINGERS IV SOLN
INTRAVENOUS | Status: DC | PRN
  Administered 2014-10-27 (×2): via INTRAVENOUS

## 2014-10-27 MED ORDER — LIDOCAINE HCL (CARDIAC) 20 MG/ML IV SOLN
INTRAVENOUS | Status: DC | PRN
  Administered 2014-10-27: 40 mg via INTRAVENOUS

## 2014-10-27 MED ORDER — GLYCOPYRROLATE 0.2 MG/ML IJ SOLN
INTRAMUSCULAR | Status: AC
  Filled 2014-10-27: qty 2

## 2014-10-27 MED ORDER — PHENOL 1.4 % MT LIQD: 1.0000 | OROMUCOSAL | Status: DC | PRN

## 2014-10-27 MED ORDER — DOCUSATE SODIUM 100 MG PO CAPS
100.0000 mg | ORAL_CAPSULE | Freq: Two times a day (BID) | ORAL | Status: DC
  Administered 2014-10-27 – 2014-10-28 (×2): 100 mg via ORAL
  Filled 2014-10-27 (×2): qty 1

## 2014-10-27 MED ORDER — SUCCINYLCHOLINE CHLORIDE 20 MG/ML IJ SOLN
INTRAMUSCULAR | Status: AC
  Filled 2014-10-27: qty 1

## 2014-10-27 MED ORDER — ONDANSETRON HCL 4 MG PO TABS: 4.0000 mg | ORAL_TABLET | Freq: Three times a day (TID) | ORAL | Status: DC | PRN

## 2014-10-27 MED ORDER — METOCLOPRAMIDE HCL 5 MG PO TABS: 5.0000 mg | ORAL_TABLET | Freq: Three times a day (TID) | ORAL | Status: DC | PRN

## 2014-10-27 MED ORDER — POTASSIUM CHLORIDE IN NACL 20-0.45 MEQ/L-% IV SOLN
INTRAVENOUS | Status: DC
  Administered 2014-10-27: 22:00:00 via INTRAVENOUS
  Filled 2014-10-27 (×3): qty 1000

## 2014-10-27 SURGICAL SUPPLY — 63 items
BAG DECANTER FOR FLEXI CONT (MISCELLANEOUS) IMPLANT
BLADE SAW SGTL 18X1.27X75 (BLADE) ×2 IMPLANT
BLADE SAW SGTL 18X1.27X75MM (BLADE) ×1
BLADE SURG 10 STRL SS (BLADE) ×3 IMPLANT
BLADE SURG ROTATE 9660 (MISCELLANEOUS) IMPLANT
BNDG COHESIVE 4X5 TAN STRL (GAUZE/BANDAGES/DRESSINGS) ×3 IMPLANT
CAPT HIP TOTAL 2 ×3 IMPLANT
COVER SURGICAL LIGHT HANDLE (MISCELLANEOUS) ×3 IMPLANT
DERMABOND ADVANCED (GAUZE/BANDAGES/DRESSINGS) ×2
DERMABOND ADVANCED .7 DNX12 (GAUZE/BANDAGES/DRESSINGS) ×1 IMPLANT
DRAPE C-ARM 42X72 X-RAY (DRAPES) IMPLANT
DRAPE IMP U-DRAPE 54X76 (DRAPES) ×3 IMPLANT
DRAPE INCISE IOBAN 66X45 STRL (DRAPES) ×3 IMPLANT
DRAPE ORTHO SPLIT 77X108 STRL (DRAPES) ×4
DRAPE SURG ORHT 6 SPLT 77X108 (DRAPES) ×2 IMPLANT
DRAPE U-SHAPE 47X51 STRL (DRAPES) ×3 IMPLANT
DRSG MEPILEX BORDER 4X8 (GAUZE/BANDAGES/DRESSINGS) ×3 IMPLANT
DURAPREP 26ML APPLICATOR (WOUND CARE) ×3 IMPLANT
ELECT BLADE 4.0 EZ CLEAN MEGAD (MISCELLANEOUS) ×3
ELECT REM PT RETURN 9FT ADLT (ELECTROSURGICAL) ×3
ELECTRODE BLDE 4.0 EZ CLN MEGD (MISCELLANEOUS) ×1 IMPLANT
ELECTRODE REM PT RTRN 9FT ADLT (ELECTROSURGICAL) ×1 IMPLANT
FACESHIELD WRAPAROUND (MASK) ×6 IMPLANT
GLOVE BIO SURGEON STRL SZ7 (GLOVE) ×3 IMPLANT
GLOVE BIO SURGEON STRL SZ7.5 (GLOVE) ×3 IMPLANT
GLOVE BIOGEL PI IND STRL 7.0 (GLOVE) ×1 IMPLANT
GLOVE BIOGEL PI IND STRL 8 (GLOVE) ×1 IMPLANT
GLOVE BIOGEL PI INDICATOR 7.0 (GLOVE) ×2
GLOVE BIOGEL PI INDICATOR 8 (GLOVE) ×2
GOWN STRL REUS W/ TWL LRG LVL3 (GOWN DISPOSABLE) ×2 IMPLANT
GOWN STRL REUS W/ TWL XL LVL3 (GOWN DISPOSABLE) ×1 IMPLANT
GOWN STRL REUS W/TWL LRG LVL3 (GOWN DISPOSABLE) ×4
GOWN STRL REUS W/TWL XL LVL3 (GOWN DISPOSABLE) ×2
KIT BASIN OR (CUSTOM PROCEDURE TRAY) ×3 IMPLANT
KIT ROOM TURNOVER OR (KITS) ×3 IMPLANT
MANIFOLD NEPTUNE II (INSTRUMENTS) ×3 IMPLANT
NDL SAFETY ECLIPSE 18X1.5 (NEEDLE) ×1 IMPLANT
NEEDLE 18GX1X1/2 (RX/OR ONLY) (NEEDLE) ×3 IMPLANT
NEEDLE HYPO 18GX1.5 SHARP (NEEDLE) ×2
NS IRRIG 1000ML POUR BTL (IV SOLUTION) ×3 IMPLANT
PACK ORTHO EXTREMITY (CUSTOM PROCEDURE TRAY) ×3 IMPLANT
PACK TOTAL JOINT (CUSTOM PROCEDURE TRAY) IMPLANT
PACK UNIVERSAL I (CUSTOM PROCEDURE TRAY) ×3 IMPLANT
PAD ARMBOARD 7.5X6 YLW CONV (MISCELLANEOUS) ×3 IMPLANT
SPONGE LAP 18X18 X RAY DECT (DISPOSABLE) ×6 IMPLANT
STOCKINETTE IMPERVIOUS LG (DRAPES) ×3 IMPLANT
SUT MNCRL AB 4-0 PS2 18 (SUTURE) ×3 IMPLANT
SUT MON AB 2-0 CT1 36 (SUTURE) ×3 IMPLANT
SUT VIC AB 0 CT1 27 (SUTURE) ×2
SUT VIC AB 0 CT1 27XBRD ANBCTR (SUTURE) ×1 IMPLANT
SUT VIC AB 1 CT1 27 (SUTURE) ×2
SUT VIC AB 1 CT1 27XBRD ANBCTR (SUTURE) ×1 IMPLANT
SYR 50ML LL SCALE MARK (SYRINGE) ×3 IMPLANT
SYR BULB IRRIGATION 50ML (SYRINGE) ×3 IMPLANT
SYRINGE 20CC LL (MISCELLANEOUS) IMPLANT
TOWEL OR 17X24 6PK STRL BLUE (TOWEL DISPOSABLE) ×3 IMPLANT
TOWEL OR 17X26 10 PK STRL BLUE (TOWEL DISPOSABLE) ×3 IMPLANT
TOWEL OR NON WOVEN STRL DISP B (DISPOSABLE) ×3 IMPLANT
TRAY FOLEY CATH 16FRSI W/METER (SET/KITS/TRAYS/PACK) IMPLANT
TUBE CONNECTING 12'X1/4 (SUCTIONS) ×1
TUBE CONNECTING 12X1/4 (SUCTIONS) ×2 IMPLANT
WATER STERILE IRR 1000ML POUR (IV SOLUTION) IMPLANT
YANKAUER SUCT BULB TIP NO VENT (SUCTIONS) ×3 IMPLANT

## 2014-10-27 NOTE — Anesthesia Procedure Notes (Signed)
Procedure Name: Intubation Date/Time: 10/27/2014 5:28 PM Performed by: Izora Gala Pre-anesthesia Checklist: Patient identified, Emergency Drugs available, Suction available and Patient being monitored Patient Re-evaluated:Patient Re-evaluated prior to inductionOxygen Delivery Method: Circle system utilized Preoxygenation: Pre-oxygenation with 100% oxygen Intubation Type: IV induction Ventilation: Mask ventilation without difficulty Laryngoscope Size: Glidescope Sabra Heck attempted x 2.  Glidescope utilized for Grade 1 view) Grade View: Grade I Tube type: Subglottic suction tube Tube size: 7.5 mm Number of attempts: 3 (2 attempts with Miller 3.  One attempt with Glide) Airway Equipment and Method: Stylet Placement Confirmation: ETT inserted through vocal cords under direct vision,  positive ETCO2 and breath sounds checked- equal and bilateral Secured at: 23 cm Tube secured with: Tape Dental Injury: Teeth and Oropharynx as per pre-operative assessment  Future Recommendations: Recommend- induction with short-acting agent, and alternative techniques readily available

## 2014-10-27 NOTE — Transfer of Care (Signed)
Immediate Anesthesia Transfer of Care Note  Patient: Seth Bell  Procedure(s) Performed: Procedure(s): TOTAL HIP ARTHROPLASTY ANTERIOR APPROACH (Left)  Patient Location: PACU  Anesthesia Type:General  Level of Consciousness: awake, alert  and oriented  Airway & Oxygen Therapy: Patient Spontanous Breathing and Patient connected to nasal cannula oxygen  Post-op Assessment: Report given to RN and Post -op Vital signs reviewed and stable  Post vital signs: Reviewed and stable  Last Vitals:  Filed Vitals:   10/27/14 1323  BP: 142/84  Pulse: 83  Temp: 36.7 C  Resp: 18    Complications: No apparent anesthesia complications

## 2014-10-27 NOTE — Anesthesia Postprocedure Evaluation (Signed)
  Anesthesia Post-op Note  Patient: Seth Bell  Procedure(s) Performed: Procedure(s): TOTAL HIP ARTHROPLASTY ANTERIOR APPROACH (Left)  Patient Location: PACU  Anesthesia Type:General  Level of Consciousness: sedated, patient cooperative and responds to stimulation  Airway and Oxygen Therapy: Patient Spontanous Breathing and Patient connected to nasal cannula oxygen  Post-op Pain: mild, able to doze  Post-op Assessment: Post-op Vital signs reviewed, Patient's Cardiovascular Status Stable, Respiratory Function Stable, Patent Airway, No signs of Nausea or vomiting and Pain level controlled              Post-op Vital Signs: Reviewed and stable  Last Vitals:  Filed Vitals:   10/27/14 2041  BP:   Pulse: 88  Temp:   Resp: 15    Complications: No apparent anesthesia complications

## 2014-10-27 NOTE — Anesthesia Preprocedure Evaluation (Addendum)
Anesthesia Evaluation  Patient identified by MRN, date of birth, ID band Patient awake    Reviewed: Allergy & Precautions, NPO status , Patient's Chart, lab work & pertinent test results  Airway Mallampati: II  TM Distance: >3 FB Neck ROM: Full    Dental no notable dental hx.    Pulmonary neg pulmonary ROS, Current Smoker,  breath sounds clear to auscultation  Pulmonary exam normal       Cardiovascular negative cardio ROS Normal cardiovascular examRhythm:Regular Rate:Normal     Neuro/Psych  Headaches, negative psych ROS   GI/Hepatic negative GI ROS, Neg liver ROS,   Endo/Other  negative endocrine ROS  Renal/GU negative Renal ROS     Musculoskeletal  (+) Arthritis -,   Abdominal   Peds  Hematology negative hematology ROS (+)   Anesthesia Other Findings   Reproductive/Obstetrics negative OB ROS                           Anesthesia Physical Anesthesia Plan  ASA: II  Anesthesia Plan:    Post-op Pain Management:    Induction:   Airway Management Planned:   Additional Equipment:   Intra-op Plan:   Post-operative Plan:   Informed Consent: I have reviewed the patients History and Physical, chart, labs and discussed the procedure including the risks, benefits and alternatives for the proposed anesthesia with the patient or authorized representative who has indicated his/her understanding and acceptance.   Dental advisory given  Plan Discussed with: CRNA  Anesthesia Plan Comments:         Anesthesia Quick Evaluation

## 2014-10-27 NOTE — Op Note (Signed)
10/27/2014  7:09 PM  PATIENT:  Seth Bell   MRN: [id number]  PRE-OPERATIVE DIAGNOSIS:  OSTEOARTHRITIS LEFT HIP  POST-OPERATIVE DIAGNOSIS:  OSTEOARTHRITIS LEFT HIP  PROCEDURE:  Procedure(s): TOTAL HIP ARTHROPLASTY ANTERIOR APPROACH  PREOPERATIVE INDICATIONS:    Seth Bell is an 59 y.o. male who has a diagnosis of <principal problem not specified> and elected for surgical management after failing conservative treatment.  The risks benefits and alternatives were discussed with the patient including but not limited to the risks of nonoperative treatment, versus surgical intervention including infection, bleeding, nerve injury, periprosthetic fracture, the need for revision surgery, dislocation, leg length discrepancy, blood clots, cardiopulmonary complications, morbidity, mortality, among others, and they were willing to proceed.     OPERATIVE REPORT     SURGEON:   MURPHY, Ernesta Amble, MD    ASSISTANT:  Lovett Calender, PA-C, She was present and scrubbed throughout the case, critical for completion in a timely fashion, and for retraction, instrumentation, and closure.     ANESTHESIA:  General    COMPLICATIONS:  None.     COMPONENTS:  Stryker acolade fit femur size 5 with a 36 mm -2.5 head ball and a PSL acetabular shell size 54 with a  polyethylene liner    PROCEDURE IN DETAIL:   The patient was met in the holding area and  identified.  The appropriate hip was identified and marked at the operative site.  The patient was then transported to the OR  and  placed under general anesthesia.  At that point, the patient was  placed in the supine position and  secured to the operating room table and all bony prominences padded. He received pre-operative antibiotics    The operative lower extremity was prepped from the iliac crest to the distal leg.  Sterile draping was performed.  Time out was performed prior to incision.      Skin incision was made just 2 cm lateral to the ASIS  extending in  line with the tensor fascia lata. Electrocautery was used to control all bleeders. I dissected down sharply to the fascia of the tensor fascia lata was confirmed that the muscle fibers beneath were running posteriorly. I then incised the fascia over the superficial tensor fascia lata in line with the incision. The fascia was elevated off the anterior aspect of the muscle the muscle was retracted posteriorly and protected throughout the case. I then used electrocautery to incise the tensor fascia lata fascia control and all bleeders. Immediately visible was the fat over top of the anterior neck and capsule.  I removed the anterior fat from the capsule and elevated the rectus muscle off of the anterior capsule. I then removed a large time of capsule. The retractors were then placed over the anterior acetabulum as well as around the superior and inferior neck.  I then removed a section of the femoral neck and a napkin ring fashion. Then used the power course to remove the femoral head from the acetabulum and thoroughly irrigated the acetabulum. I sized the femoral head.    I then exposed the deep acetabulum, cleared out any tissue including the ligamentum teres.   After adequate visualization, I excised the labrum, and then sequentially reamed.  I placed the trial acetabulum, which seated nicely, and then impacted the real cup into place.  Appropriate version and inclination was confirmed clinically matching their bony anatomy, and also with the use transverse acetabular ligament.  I placed a 30m screw in the posterior/superio position with an  excellent bite.    I then placed the polyethylene liner in place  I then abducted the leg and released the external rotators from the posterior femur allowing it to be easily delivered up lateral and anterior to the acetabulum for preparation of the femoral canal.    I then prepared the proximal femur using the cookie-cutter and then sequentially reamed and  broached.  A trial broach, neck, and head was utilized, and I reduced the hip and it was found to have excellent stability with functional range of motion..  I then impacted the real femoral prosthesis into place into the appropriate version, slightly anteverted to the normal anatomy, and I impacted the real head ball into place. The hip was then reduced and taken through functional range of motion and found to have excellent stability. Leg lengths were restored.  I then irrigated the hip copiously again with, and repaired the fascia with Vicryl, followed by monocryl for the subcutaneous tissue, Monocryl for the skin, Steri-Strips and sterile gauze. The wounds were injected. The patient was then awakened and returned to PACU in stable and satisfactory condition. There were no complications.  POST OPERATIVE PLAN: WBAT, DVT px: SCD's/TED and ASA 325  Edmonia Lynch, MD Orthopedic Surgeon [phone]   This note was generated using a template and dragon dictation system. In light of that, I have reviewed the note and all aspects of it are applicable to this case. Any dictation errors are due to the computerized dictation system.

## 2014-10-27 NOTE — Interval H&P Note (Signed)
History and Physical Interval Note:  10/27/2014 8:09 AM  Seth Bell  has presented today for surgery, with the diagnosis of OA LEFT HIP  The various methods of treatment have been discussed with the patient and family. After consideration of risks, benefits and other options for treatment, the patient has consented to  Procedure(s): TOTAL HIP ARTHROPLASTY ANTERIOR APPROACH (Left) as a surgical intervention .  The patient's history has been reviewed, patient examined, no change in status, stable for surgery.  I have reviewed the patient's chart and labs.  Questions were answered to the patient's satisfaction.     [name] D

## 2014-10-27 NOTE — H&P (View-Only) (Signed)
TOTAL HIP ADMISSION H&P  Patient is admitted for left total hip arthroplasty.  Subjective:  Chief Complaint: left hip pain  HPI: Seth Bell, 59 y.o. male, has a history of pain and functional disability in the left hip(s) due to arthritis and patient has failed non-surgical conservative treatments for greater than 12 weeks to include NSAID's and/or analgesics, use of assistive devices and activity modification.  Onset of symptoms was gradual starting 6 years ago with gradually worsening course since that time.The patient noted no past surgery on the left hip(s).  Patient currently rates pain in the left hip at 8 out of 10 with activity. Patient has night pain, worsening of pain with activity and weight bearing and pain that interfers with activities of daily living. Patient has evidence of joint space narrowing by imaging studies. This condition presents safety issues increasing the risk of falls.  There is no current active infection.  There are no active problems to display for this patient.  No past medical history on file.  No past surgical history on file.  No prescriptions prior to admission   No Known Allergies  History  Substance Use Topics  . Smoking status: Current Every Day Smoker  . Smokeless tobacco: Not on file  . Alcohol Use: Yes    No family history on file.   Review of Systems  Constitutional: Negative for fever and chills.  HENT: Negative for congestion.   Eyes: Negative for blurred vision and pain.  Respiratory: Negative for cough and shortness of breath.   Cardiovascular: Negative for chest pain and palpitations.  Gastrointestinal: Negative for nausea, vomiting and abdominal pain.  Musculoskeletal:       Left hip pain with ambulation  Skin: Negative for rash.  Neurological: Negative for dizziness, seizures and headaches.  Psychiatric/Behavioral: Negative for depression and suicidal ideas.    Objective:  Physical Exam  Constitutional: He is oriented to  person, place, and time. He appears well-developed and well-nourished.  HENT:  Head: Normocephalic and atraumatic.  Eyes: Conjunctivae and EOM are normal. Pupils are equal, round, and reactive to light.  Neck: Normal range of motion. Neck supple.  Cardiovascular: Normal rate and intact distal pulses.   Respiratory: Effort normal and breath sounds normal.  GI: Soft. Bowel sounds are normal.  Musculoskeletal:  Left hip has decreased ROM due to pain  Neurological: He is alert and oriented to person, place, and time.  Skin: Skin is warm and dry.  Psychiatric: He has a normal mood and affect. His behavior is normal. Judgment and thought content normal.    Vital signs in last 24 hours:    Labs:   Estimated body mass index is 20.93 kg/(m^2) as calculated from the following:   Height as of 09/21/14: '5\' 11"'$  (1.803 m).   Weight as of 09/21/14: 68.04 kg (150 lb).   Imaging Review Plain radiographs demonstrate severe degenerative joint disease of the left hip(s). The bone quality appears to be fair for age and reported activity level.  Assessment/Plan:  End stage arthritis, left hip(s)  The patient history, physical examination, clinical judgement of the provider and imaging studies are consistent with end stage degenerative joint disease of the left hip(s) and total hip arthroplasty is deemed medically necessary. The treatment options including medical management, injection therapy, arthroscopy and arthroplasty were discussed at length. The risks and benefits of total hip arthroplasty were presented and reviewed. The risks due to aseptic loosening, infection, stiffness, dislocation/subluxation,  thromboembolic complications and other imponderables were discussed.  The patient acknowledged the explanation, agreed to proceed with the plan and consent was signed. Patient is being admitted for inpatient treatment for surgery, pain control, PT, OT, prophylactic antibiotics, VTE prophylaxis, progressive  ambulation and ADL's and discharge planning.The patient is planning to be discharged home with home health services

## 2014-10-27 NOTE — Discharge Instructions (Signed)
INSTRUCTIONS AFTER JOINT REPLACEMENT   o Remove items at home which could result in a fall. This includes throw rugs or furniture in walking pathways o ICE to the affected joint every three hours while awake for 30 minutes at a time, for at least the first 3-5 days, and then as needed for pain and swelling.  Continue to use ice for pain and swelling. You may notice swelling that will progress down to the foot and ankle.  This is normal after surgery.  Elevate your leg when you are not up walking on it.   o Continue to use the breathing machine you got in the hospital (incentive spirometer) which will help keep your temperature down.  It is common for your temperature to cycle up and down following surgery, especially at night when you are not up moving around and exerting yourself.  The breathing machine keeps your lungs expanded and your temperature down.  DIET:  As you were doing prior to hospitalization, we recommend a well-balanced diet.  DRESSING / WOUND CARE / SHOWERING  Keep the surgical dressing until follow up.  IF THE DRESSING FALLS OFF or the wound gets wet inside, change the dressing with sterile gauze.  Please use good hand washing techniques before changing the dressing.  Do not use any lotions or creams on the incision until instructed by your surgeon.    ACTIVITY  o Increase activity slowly as tolerated, but follow the weight bearing instructions below.   o No driving for 6 weeks or until further direction given by your physician.  You cannot drive while taking narcotics.  o No lifting or carrying greater than 10 lbs. until further directed by your surgeon. o Avoid periods of inactivity such as sitting longer than an hour when not asleep. This helps prevent blood clots.  o You may return to work once you are authorized by your doctor.    WEIGHT BEARING   Weight bearing as tolerated with assist device (walker, cane, etc) as directed, use it as long as suggested by your surgeon  or therapist, typically at least 4-6 weeks.   CONSTIPATION  Constipation is defined medically as fewer than three stools per week and severe constipation as less than one stool per week.  Even if you have a regular bowel pattern at home, your normal regimen is likely to be disrupted due to multiple reasons following surgery.  Combination of anesthesia, postoperative narcotics, change in appetite and fluid intake all can affect your bowels.   YOU MUST use at least one of the following options; they are listed in order of increasing strength to get the job done.  They are all available over the counter, and you may need to use some, POSSIBLY even all of these options:    Drink plenty of fluids (prune juice may be helpful) and high fiber foods Colace 100 mg by mouth twice a day  Senokot for constipation as directed and as needed Dulcolax (bisacodyl), take with full glass of water  Miralax (polyethylene glycol) once or twice a day as needed.  If you have tried all these things and are unable to have a bowel movement in the first 3-4 days after surgery call either your surgeon or your primary doctor.    If you experience loose stools or diarrhea, hold the medications until you stool forms back up.  If your symptoms do not get better within 1 week or if they get worse, check with your doctor.  If you  experience "the worst abdominal pain ever" or develop nausea or vomiting, please contact the office immediately for further recommendations for treatment.   ITCHING:  If you experience itching with your medications, try taking only a single pain pill, or even half a pain pill at a time.  You can also use Benadryl over the counter for itching or also to help with sleep.   TED HOSE STOCKINGS:  Use stockings on both legs until for at least 2 weeks or as directed by physician office. They may be removed at night for sleeping.  MEDICATIONS:  See your medication summary on the After Visit Summary that  nursing will review with you.  You may have some home medications which will be placed on hold until you complete the course of blood thinner medication.  It is important for you to complete the blood thinner medication as prescribed.  PRECAUTIONS:  If you experience chest pain or shortness of breath - call 911 immediately for transfer to the hospital emergency department.   If you develop a fever greater that 101 F, purulent drainage from wound, increased redness or drainage from wound, foul odor from the wound/dressing, or calf pain - CONTACT YOUR SURGEON.                                                   FOLLOW-UP APPOINTMENTS:  If you do not already have a post-op appointment, please call the office for an appointment to be seen by your surgeon.  Guidelines for how soon to be seen are listed in your After Visit Summary, but are typically between 1-4 weeks after surgery.  MAKE SURE YOU:   Understand these instructions.   Get help right away if you are not doing well or get worse.    Thank you for letting us be a part of your medical care team.  It is a privilege we respect greatly.  We hope these instructions will help you stay on track for a fast and full recovery!

## 2014-10-28 ENCOUNTER — Encounter (HOSPITAL_COMMUNITY): Payer: Self-pay | Admitting: Orthopedic Surgery

## 2014-10-28 DIAGNOSIS — M1612 Unilateral primary osteoarthritis, left hip: Secondary | ICD-10-CM | POA: Diagnosis present

## 2014-10-28 NOTE — Progress Notes (Signed)
Discharge home. Home discharge instruction given, no question verbalized. 

## 2014-10-28 NOTE — Progress Notes (Signed)
     Subjective:  POD#1 LATH arthroplasty. Patient reports pain as mild.  Resting comfortably in bed.   Objective:   VITALS:   Filed Vitals:   10/28/14 0035 10/28/14 0124 10/28/14 0234 10/28/14 0634  BP: 134/79 129/80 98/70 141/75  Pulse: 66 74 81 77  Temp: 98.4 F (36.9 C) 98.4 F (36.9 C) 98 F (36.7 C) 98.6 F (37 C)  TempSrc: Oral Oral Oral Oral  Resp: '16 16 16 16  '$ Height:      Weight:      SpO2: 98% 99% 99% 99%    Neurologically intact ABD soft Neurovascular intact Sensation intact distally Intact pulses distally Dorsiflexion/Plantar flexion intact Incision: dressing C/D/I   Lab Results  Component Value Date   WBC 3.4* 10/22/2014   HGB 14.2 10/22/2014   HCT 40.2 10/22/2014   MCV 95.7 10/22/2014   PLT 294 10/22/2014   BMET    Component Value Date/Time   NA 137 10/22/2014 0923   K 4.3 10/22/2014 0923   CL 103 10/22/2014 0923   CO2 26 10/22/2014 0923   GLUCOSE 100* 10/22/2014 0923   BUN 6 10/22/2014 0923   CREATININE 0.88 10/22/2014 0923   CALCIUM 8.9 10/22/2014 0923   GFRNONAA >60 10/22/2014 0923   GFRAA >60 10/22/2014 0923     Assessment/Plan: 1 Day Post-Op   Active Problems:   DJD (degenerative joint disease)   Up with therapy WBAT in the LLE ASA '325mg'$  daily for DVT prophylaxis Will plan for d/c home later today as long as PT goes well and pain is well controlled   Seth Bell Marie 10/28/2014, 7:34 AM Cell [phone]

## 2014-10-28 NOTE — Progress Notes (Addendum)
Physical Therapy Treatment Patient Details Name: Seth Bell MRN: [id number] DOB: [birth year] Today's Date: 10/28/2014    History of Present Illness Patient is a 59 yo male s/p left THA anterior approach.    PT Comments    Patient seen for 2nd session. Mobilizing well in room. Reviewed HEP with patient performing 3 to 5 reps per exercise to ensure technique. Patient tolerated well.   Follow Up Recommendations  Home health PT;Supervision/Assistance - 24 hour     Equipment Recommendations  None recommended by PT (pt states he has a RW at home)    Recommendations for Other Services       Precautions / Restrictions Precautions Precautions: Fall Restrictions Weight Bearing Restrictions: Yes LLE Weight Bearing: Weight bearing as tolerated    Mobility  Bed Mobility Received in chair             General bed mobility comments: increased time to perform, no physical assist required  Transfers Overall transfer level: Modified independent Equipment used: Rolling walker (2 wheeled) Transfers: Sit to/from Stand Sit to Stand: Supervision         General transfer comment: patient able to get up and down without assist or cues  Ambulation/Gait Ambulation/Gait assistance: Supervision Ambulation Distance (Feet): 30 Feet Assistive device: Rolling walker (2 wheeled)       General Gait Details: in room ambulation, ambulated 10 ft without device   Stairs            Wheelchair Mobility    Modified Rankin (Stroke Patients Only)       Balance                                    Cognition Arousal/Alertness: Awake/alert Behavior During Therapy: WFL for tasks assessed/performed Overall Cognitive Status: Within Functional Limits for tasks assessed                      Exercises Total Joint Exercises Ankle Circles/Pumps: AROM;Left;5 reps Quad Sets: AROM;Left (performed 5 second hold x3) Short Arc Quad: AROM;Left (with rolled towel x 3  reps) Heel Slides: AROM;Left (x3) Hip ABduction/ADduction: AROM;Left (x3) Straight Leg Raises: AROM;Left;5 reps (increased pain performed with assist) Long Arc Quad: AROM;Left (x3) Marching in Standing: AROM;Left (with RW) Standing Hip Extension: AROM;Left (with RW)    General Comments        Pertinent Vitals/Pain Pain Assessment: 0-10 Pain Score: 8  Pain Location: left hip Pain Descriptors / Indicators: Operative site guarding Pain Intervention(s): Monitored during session;Repositioned;Limited activity within patient's tolerance    Home Living Family/patient expects to be discharged to:: Private residence Living Arrangements: Non-relatives/Friends (girlfriend) Available Help at Discharge: Family;Friend(s) Type of Home: House Home Access: Level entry   Home Layout: One level Home Equipment: Walker - 2 wheels;Crutches;Bedside commode      Prior Function Level of Independence: Independent      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[employer] (current goals can now be found in the care plan section) Acute Rehab PT Goals Patient Stated Goal: to go home PT Goal Formulation: With patient Time For Goal Achievement: 11/11/14 Potential to Achieve Goals: Good Progress towards PT goals: Not progressing toward goals - comment    Frequency  7X/week    PT Plan Current plan remains appropriate    Co-evaluation             End of Session Equipment  Utilized During Treatment: Gait belt Activity Tolerance: Patient tolerated treatment well;Patient limited by pain Patient left: in chair;with call bell/phone within reach     Time: 1208-1223 PT Time Calculation (min) (ACUTE ONLY): 15 min  Charges:  $Self Care/Home Management: 8-22                    G CodesDuncan Dull 11-05-2014, 2:29 PM Alben Deeds, [place] DPT  [phone]

## 2014-10-28 NOTE — Discharge Summary (Signed)
Physician Discharge Summary  Patient ID: Seth Bell MRN: [id number] DOB/AGE: 1955-08-16 59 y.o.  Admit date: 10/27/2014 Discharge date: 10/28/2014  Admission Diagnoses:  Primary osteoarthritis of left hip  Discharge Diagnoses:  Principal Problem:   Primary osteoarthritis of left hip Active Problems:   DJD (degenerative joint disease)   Past Medical History  Diagnosis Date  . Joint pain   . Arthritis     "left hip" (10/27/2014)    Surgeries: Procedure(s): TOTAL HIP ARTHROPLASTY ANTERIOR APPROACH on 10/27/2014   Consultants (if any):    Discharged Condition: Improved  Hospital Course: Seth Bell is an 59 y.o. male who was admitted 10/27/2014 with a diagnosis of Primary osteoarthritis of left hip and went to the operating room on 10/27/2014 and underwent the above named procedures.    He was given perioperative antibiotics:      Anti-infectives    Start     Dose/Rate Route Frequency Ordered Stop   10/27/14 2200  ceFAZolin (ANCEF) IVPB 2 g/50 mL premix     2 g 100 mL/hr over 30 Minutes Intravenous Every 6 hours 10/27/14 2117 10/28/14 0442    .  He was given sequential compression devices, early ambulation, and ASA '325mg'$  for DVT prophylaxis.  He benefited maximally from the hospital stay and there were no complications.    Recent vital signs:  Filed Vitals:   10/28/14 0634  BP: 141/75  Pulse: 77  Temp: 98.6 F (37 C)  Resp: 16    Recent laboratory studies:  Lab Results  Component Value Date   HGB 14.2 10/22/2014   HGB 14.5 10/07/2014   HGB 14.7 09/21/2014   Lab Results  Component Value Date   WBC 3.4* 10/22/2014   PLT 294 10/22/2014   Lab Results  Component Value Date   INR 1.14 10/22/2014   Lab Results  Component Value Date   NA 137 10/22/2014   K 4.3 10/22/2014   CL 103 10/22/2014   CO2 26 10/22/2014   BUN 6 10/22/2014   CREATININE 0.88 10/22/2014   GLUCOSE 100* 10/22/2014    Discharge Medications:     Medication List    STOP taking  these medications        oxyCODONE-acetaminophen 5-325 MG per tablet  Commonly known as:  PERCOCET/ROXICET      TAKE these medications        aspirin 325 MG tablet  Take 1 tablet (325 mg total) by mouth daily.     docusate sodium 100 MG capsule  Commonly known as:  COLACE  Take 1 capsule (100 mg total) by mouth 2 (two) times daily.     HYDROcodone-acetaminophen 5-325 MG per tablet  Commonly known as:  NORCO  Take 1-2 tablets by mouth every 6 (six) hours as needed for moderate pain.     methocarbamol 500 MG tablet  Commonly known as:  ROBAXIN  Take 1 tablet (500 mg total) by mouth 4 (four) times daily.     ondansetron 4 MG tablet  Commonly known as:  ZOFRAN  Take 1 tablet (4 mg total) by mouth every 8 (eight) hours as needed for nausea or vomiting.        Diagnostic Studies: Dg Pelvis Portable  10/27/2014   CLINICAL DATA:  LEFT total hip arthroplasty. Postoperative radiographs.  EXAM: PORTABLE PELVIS 1-2 VIEWS  COMPARISON:  09/21/2014.  FINDINGS: Uncomplicated new LEFT total hip arthroplasty with expected postsurgical changes in the soft tissues.  IMPRESSION: Uncomplicated new LEFT total hip arthroplasty.   Electronically  Signed   By: Dereck Ligas M.D.   On: 10/27/2014 22:20    Disposition: 01-Home or Self Care  Discharge Instructions    Weight bearing as tolerated    Complete by:  As directed   Laterality:  left  Extremity:  Lower           Follow-up Information    Follow up with MURPHY, TIMOTHY D, MD In 1 week.   Specialty:  Orthopedic Surgery   Contact information:   [address] [place] [id number] [phone]        Signed: Gae Dry 10/28/2014, 7:38 AM Cell [phone]

## 2014-10-28 NOTE — Progress Notes (Signed)
Occupational Therapy Evaluation Patient Details Name: Seth Bell MRN: [id number] DOB: July 23, 1955 Today's Date: 10/28/2014    History of Present Illness Patient is a 59 yo male s/p left THA anterior approach.   Clinical Impression   Patient admitted with above. Patient independent PTA. Patient currently functioning at an overall supervision level. D/C from acute OT services and no additional follow-up OT needs at this time. All appropriate education provided to patient. Please re-order OT if needed.      Follow Up Recommendations  No OT follow up;Supervision - Intermittent    Equipment Recommendations  None recommended by OT    Recommendations for Other Services  None at this time     Precautions / Restrictions Precautions Precautions: Fall Restrictions Weight Bearing Restrictions: Yes LLE Weight Bearing: Weight bearing as tolerated      Mobility Bed Mobility  Patient found seated in recliner upon OT entering/exiting room  Transfers Overall transfer level: Needs assistance Equipment used: Rolling walker (2 wheeled) Transfers: Sit to/from Stand Sit to Stand: Supervision General transfer comment: supervison and cues for safety    Balance  See PT note    ADL Overall ADL's : Needs assistance/impaired General ADL Comments: Patient with increased pain when trying to doff sock on RLE, encouraged patient to listen to body and stop if having increased pain. However, encouraged patient to work on reaching RLE. Patient reports he has assistance from neighbors, as his girlfriend has MS and uses a wheelchair. Patient overall distant supervision using RW, but requires cueing for safety.  Patient engaged in functional ambulation using RW for toilet transfer using BSC over toilet. Educated patient on shower stall transfers and using BSC  prn. At this time, no further acute OT needs identified.      Vision Additional Comments: no change in vision          Pertinent Vitals/Pain  Pain Assessment: 0-10 Pain Score: 9  Pain Location: left hip Pain Descriptors / Indicators: Grimacing;Discomfort;Aching Pain Intervention(s): Limited activity within patient's tolerance;Monitored during session     Hand Dominance Right   Extremity/Trunk Assessment Upper Extremity Assessment Upper Extremity Assessment: Overall WFL for tasks assessed   Lower Extremity Assessment Lower Extremity Assessment: Defer to PT evaluation   Cervical / Trunk Assessment Cervical / Trunk Assessment: Normal   Communication Communication Communication: No difficulties   Cognition Arousal/Alertness: Awake/alert Behavior During Therapy: WFL for tasks assessed/performed Overall Cognitive Status: Within Functional Limits for tasks assessed              Home Living Family/patient expects to be discharged to:: Private residence Living Arrangements: Non-relatives/Friends (girlfriend) Available Help at Discharge: Family;Friend(s) Type of Home: House Home Access: Level entry     Home Layout: One level     Bathroom Shower/Tub: Walk-in shower;Door   [employer] Toilet: Standard     Home Equipment: Environmental consultant - 2 wheels;Crutches;Bedside commode   Prior Functioning/Environment Level of Independence: Independent  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[employer]    OT Diagnosis: Generalized weakness;Acute pain   OT Problem List:  n/a, no further acute OT needs identified    OT Treatment/Interventions:   n/a, no further acute OT needs identified    OT Goals(Current goals can be found in the care plan section) Acute Rehab OT Goals Patient Stated Goal: to go home  OT Frequency:   n/a, no further acute OT needs identified    Barriers to D/C:  None known at this time   End of Session Equipment Utilized During  Treatment: Rolling walker  Activity Tolerance: Patient tolerated treatment well Patient left: in chair;with call bell/phone within reach   Time: 1141-1152 OT Time Calculation (min): 11  min Charges:  OT General Charges $OT Visit: 1 Procedure OT Evaluation $Initial OT Evaluation Tier I: 1 Procedure  Rhyder Bratz , MS, OTR/L, CLT Pager: [phone]  10/28/2014, 12:03 PM

## 2014-10-28 NOTE — Evaluation (Signed)
Physical Therapy Evaluation Patient Details Name: Seth Bell MRN: [id number] DOB: 1955/11/22 Today's Date: 10/28/2014   History of Present Illness  Patient is a 59 yo male s/p left THA anterior approach.  Clinical Impression  Patient demonstrates deficits in functional mobility as indicated below. Will benefit from continued skilled PT to address deficits and maximize function. Will see as indicated and progress as tolerated. Recommend HHPT upom acute discharge.     Follow Up Recommendations Home health PT;Supervision/Assistance - 24 hour    Equipment Recommendations  None recommended by PT (pt states he has a RW at home)    Recommendations for Other Services       Precautions / Restrictions Precautions Precautions: Fall Restrictions Weight Bearing Restrictions: Yes LLE Weight Bearing: Weight bearing as tolerated      Mobility  Bed Mobility Overal bed mobility: Modified Independent             General bed mobility comments: increased time to perform, no physical assist required  Transfers Overall transfer level: Needs assistance Equipment used: Rolling walker (2 wheeled) Transfers: Sit to/from Stand Sit to Stand: Min guard         General transfer comment: VCs for hand placement and safety with elevation, good technique for descent to chair  Ambulation/Gait Ambulation/Gait assistance: Supervision Ambulation Distance (Feet): 120 Feet Assistive device: Rolling walker (2 wheeled) Gait Pattern/deviations: Step-through pattern;Decreased stride length;Antalgic Gait velocity: decreased Gait velocity interpretation: Below normal speed for age/gender General Gait Details: steady with ambulation, cues for step through ambulation and sequencing  Stairs            Wheelchair Mobility    Modified Rankin (Stroke Patients Only)       Balance Overall balance assessment: Needs assistance   Sitting balance-Leahy Scale: Good     Standing balance support:  Bilateral upper extremity supported Standing balance-Leahy Scale: Fair Standing balance comment: use of RW for stability                             Pertinent Vitals/Pain Pain Assessment: 0-10 Pain Score: 9  Pain Location: left hip Pain Descriptors / Indicators: Operative site guarding Pain Intervention(s): Limited activity within patient's tolerance;Monitored during session;Repositioned;Patient requesting pain meds-RN notified    Home Living Family/patient expects to be discharged to:: Private residence Living Arrangements: Non-relatives/Friends Available Help at Discharge: Family;Friend(s) Type of Home: House Home Access: Level entry     Home Layout: One level Home Equipment: Environmental consultant - 2 wheels;Crutches Additional Comments: patient has walk in shower    Prior Function Level of Independence: Independent         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[employer]     [place] Hand: Right    Extremity/Trunk Assessment   Upper Extremity Assessment: Overall WFL for tasks assessed           Lower Extremity Assessment: LLE deficits/detail         Communication   Communication: No difficulties  Cognition Arousal/Alertness: Awake/alert Behavior During Therapy: WFL for tasks assessed/performed Overall Cognitive Status: Within Functional Limits for tasks assessed                      General Comments      Exercises  SLR, Quad sets, and Abduction/adduction AROM      Assessment/Plan    PT Assessment Patient needs continued PT services  PT Diagnosis Difficulty walking;Acute pain  PT Problem List Decreased strength;Decreased activity tolerance;Decreased balance;Decreased coordination;Decreased mobility;Decreased knowledge of use of DME;Pain  PT Treatment Interventions DME instruction;Gait training;Functional mobility training;Therapeutic activities;Therapeutic exercise;Balance training;Patient/family education   PT Goals (Current  goals can be found in the Care Plan section) Acute Rehab PT Goals Patient Stated Goal: to go home PT Goal Formulation: With patient Time For Goal Achievement: 11/11/14 Potential to Achieve Goals: Good    Frequency 7X/week   Barriers to discharge        Co-evaluation               End of Session Equipment Utilized During Treatment: Gait belt Activity Tolerance: Patient tolerated treatment well;Patient limited by pain Patient left: in chair;with call bell/phone within reach Nurse Communication: Mobility status         Time: 8366-2947 PT Time Calculation (min) (ACUTE ONLY): 24 min   Charges:   PT Evaluation $Initial PT Evaluation Tier I: 1 Procedure PT Treatments $Gait Training: 8-22 mins   PT G CodesDuncan Dull 11/11/2014, 8:13 AM Alben Deeds, PT DPT  [phone]

## 2014-10-28 NOTE — Care Management Note (Signed)
Case Management Note  Patient Details  Name: Brysin Towery MRN: [id number] Date of Birth: Apr 12, 1956  Subjective/Objective:    Pt admitted on 10/27/14 s/p Lt THA.  PTA, pt resides at home with friend.                  Action/Plan: Pt for dc home today.  Pt states lady friend at home has RW and [facility] that he can use.  Referral to [facility], per choice for HHPT.  Start of care for [facility] 24-48h post dc date.    Expected Discharge Date:     10/28/14             Expected Discharge Plan:  [place]  In-House Referral:     Discharge planning Services  CM Consult  Post Acute Care Choice:    Choice offered to:  Patient  DME Arranged:  N/A DME Agency:     HH Arranged:  PT [place]:  [place]  Status of Service:  Completed, signed off  Medicare Important Message Given:  No Date Medicare IM Given:    Medicare IM give by:    Date Additional Medicare IM Given:    Additional Medicare Important Message give by:     If discussed at [place] of Stay Meetings, dates discussed:    Additional Comments:  Reinaldo Raddle, RN, BSN  Trauma/Neuro ICU Case Manager [phone]

## 2016-04-28 IMAGING — CR DG PORTABLE PELVIS
1 series · 1 of 1 positions shown · non-contrast
Comparison: 09/21/2014.

CLINICAL DATA: LEFT total hip arthroplasty. Postoperative
radiographs.

EXAM:
PORTABLE PELVIS 1-2 VIEWS

[AP]
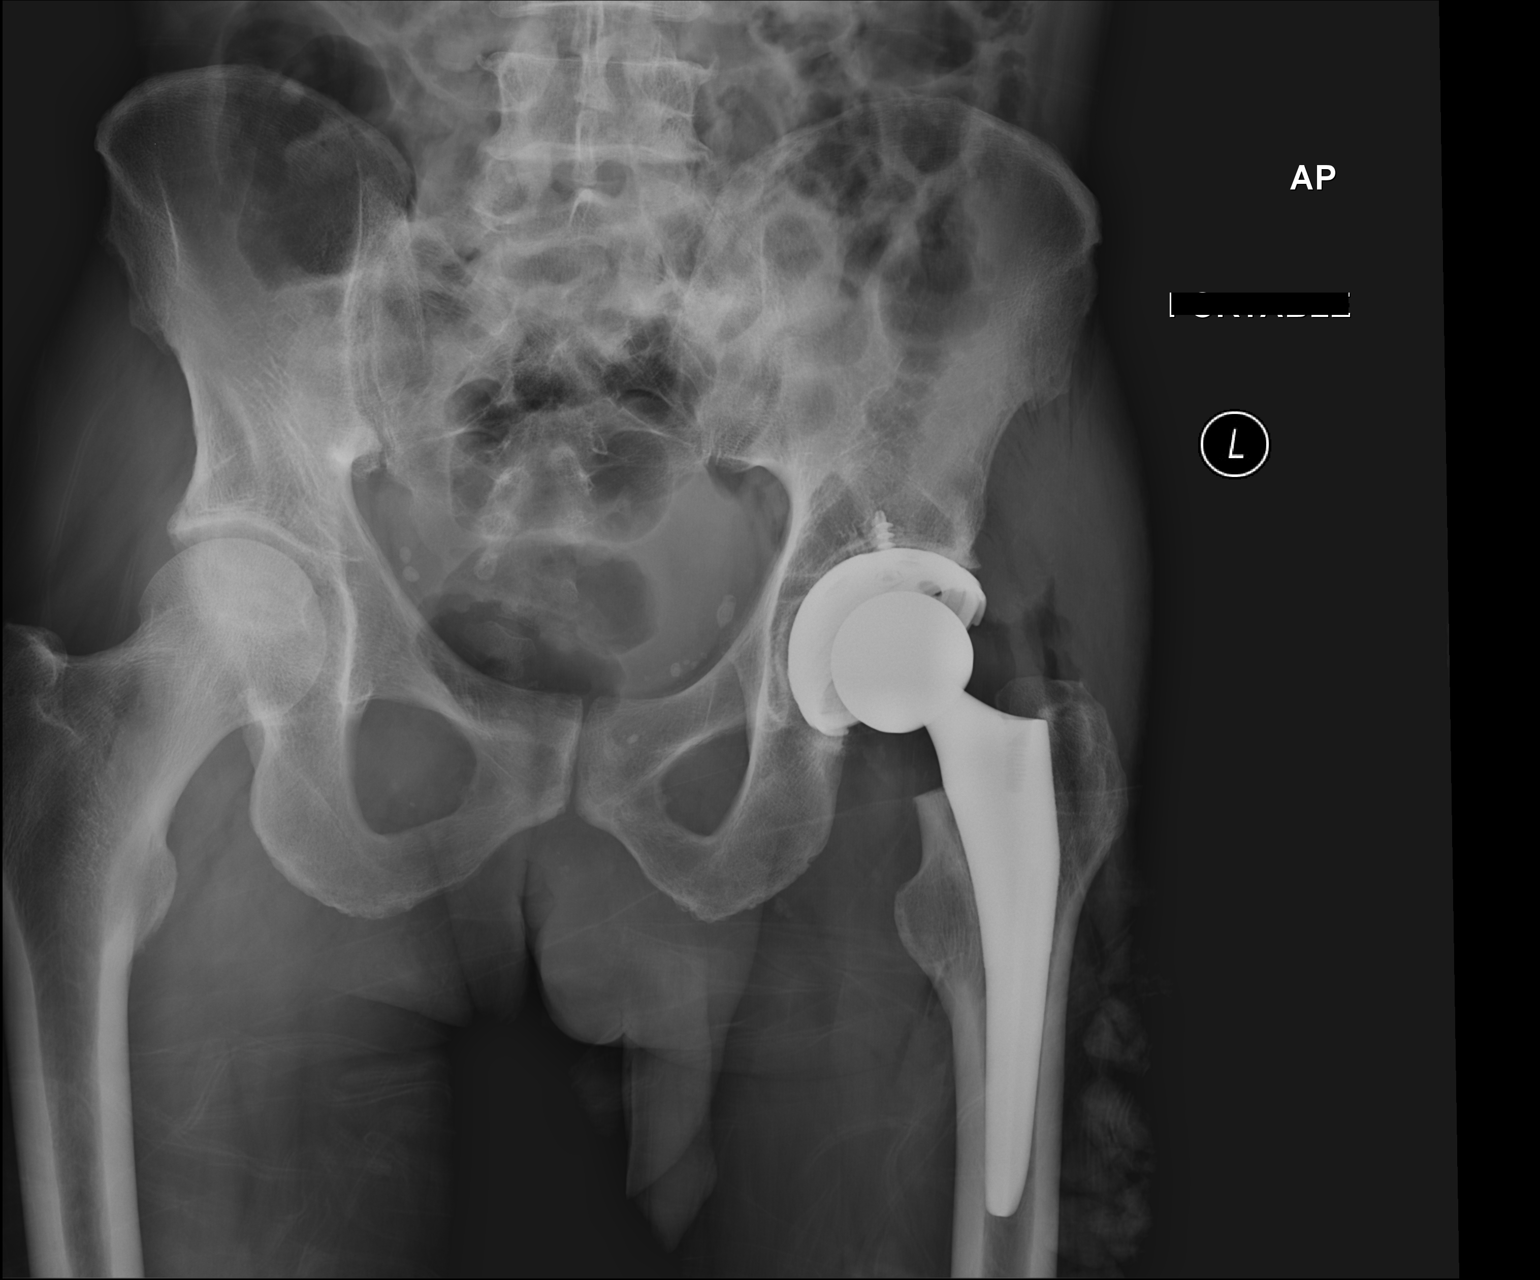

[1 of 1 positions shown; findings below may reference images not displayed]

FINDINGS: Uncomplicated new LEFT total hip arthroplasty with expected
postsurgical changes in the soft tissues.
IMPRESSION: Uncomplicated new LEFT total hip arthroplasty.

## 2016-11-27 ENCOUNTER — Emergency Department (HOSPITAL_COMMUNITY)
Admission: EM | Admit: 2016-11-27 | Discharge: 2016-11-27 | Disposition: A | Discharge status: Home / Self Care | Payer: Managed Care, Other (non HMO) | Attending: Emergency Medicine | Admitting: Emergency Medicine

## 2016-11-27 ENCOUNTER — Encounter (HOSPITAL_COMMUNITY): Payer: Self-pay | Admitting: Emergency Medicine

## 2016-11-27 DIAGNOSIS — E871 Hypo-osmolality and hyponatremia: Secondary | ICD-10-CM | POA: Insufficient documentation

## 2016-11-27 DIAGNOSIS — F17210 Nicotine dependence, cigarettes, uncomplicated: Secondary | ICD-10-CM | POA: Insufficient documentation

## 2016-11-27 DIAGNOSIS — R030 Elevated blood-pressure reading, without diagnosis of hypertension: Secondary | ICD-10-CM | POA: Insufficient documentation

## 2016-11-27 DIAGNOSIS — Z96642 Presence of left artificial hip joint: Secondary | ICD-10-CM | POA: Insufficient documentation

## 2016-11-27 DIAGNOSIS — R609 Edema, unspecified: Secondary | ICD-10-CM

## 2016-11-27 DIAGNOSIS — Z79899 Other long term (current) drug therapy: Secondary | ICD-10-CM | POA: Insufficient documentation

## 2016-11-27 DIAGNOSIS — M2550 Pain in unspecified joint: Secondary | ICD-10-CM | POA: Insufficient documentation

## 2016-11-27 DIAGNOSIS — Z7982 Long term (current) use of aspirin: Secondary | ICD-10-CM | POA: Insufficient documentation

## 2016-11-27 LAB — COMPREHENSIVE METABOLIC PANEL
ALK PHOS: 51 U/L (ref 38–126)
ALT: 18 U/L (ref 17–63)
AST: 49 U/L — AB (ref 15–41)
Albumin: 3 g/dL — ABNORMAL LOW (ref 3.5–5.0)
Anion gap: 9 (ref 5–15)
BILIRUBIN TOTAL: 1.4 mg/dL — AB (ref 0.3–1.2)
BUN: 7 mg/dL (ref 6–20)
CALCIUM: 9 mg/dL (ref 8.9–10.3)
CO2: 24 mmol/L (ref 22–32)
CREATININE: 0.89 mg/dL (ref 0.61–1.24)
Chloride: 93 mmol/L — ABNORMAL LOW (ref 101–111)
Glucose, Bld: 104 mg/dL — ABNORMAL HIGH (ref 65–99)
Potassium: 3.5 mmol/L (ref 3.5–5.1)
Sodium: 126 mmol/L — ABNORMAL LOW (ref 135–145)
Total Protein: 8 g/dL (ref 6.5–8.1)

## 2016-11-27 MED ORDER — ACETAMINOPHEN 325 MG PO TABS
650.0000 mg | ORAL_TABLET | Freq: Once | ORAL | Status: AC
  Administered 2016-11-27: 650 mg via ORAL
  Filled 2016-11-27: qty 2

## 2016-11-27 NOTE — Discharge Instructions (Signed)
Take Tylenol 650 mg every 4 hours as needed for pain. Avoid alcohol, beer or wine. Call Dr. Sherilyn Cooter office tomorrow to schedule an appointment for within one week. Your blood sodium level is low today at 126 and should be rechecked at Dr. Sherilyn Cooter office. Your blood pressure is elevated at 167/99 and should be rechecked at Dr. Sherilyn Cooter office within a week.

## 2016-11-27 NOTE — ED Triage Notes (Signed)
Pt. Stated, I usually have swelling but last night it was my legs, feet and ankles , usually its just my feet.

## 2016-11-27 NOTE — ED Notes (Addendum)
Pt states swelling and pain in feet, ankles, knees, and wrist bilaterally that began 2 days ago

## 2016-11-27 NOTE — ED Provider Notes (Addendum)
[facility] DEPT Provider Note   CSN: [id number] Arrival date & time: 11/27/16  1342  By signing my name below, I, [name], attest that this documentation has been prepared under the direction and in the presence of Orlie Dakin, MD. Electronically Signed: Lise Auer, ED Scribe. 11/27/16. 4:43 PM.  History   Chief Complaint Chief Complaint  Patient presents with  . Leg Swelling   HPI  HPI Comments: Seth Bell is a 61 y.o. male with no pertinent history, who presents to the Emergency Department complaining of gradually worsening, persistent bilateral lower extremities and bilateral wrist swelling that began three days ago. Pt states he works at [employer], where he ambulates on cement floors all day. He has taken ASA without relief of symptoms.  He reports a hx of bilateral swelling in his ankles, but not his legs and wrist.No fever no injury no other associated symptoms Pt reports tobacco usage, having one pack a day. He also reports alcohol usage at an unknown amount. Denies abdominal pain, shortness of breath, fever, dysuria, or other associated symptoms.  Past Medical History:  Diagnosis Date  . Arthritis    "left hip" (10/27/2014)  . Joint pain    Patient Active Problem List   Diagnosis Date Noted  . Primary osteoarthritis of left hip 10/28/2014  . DJD (degenerative joint disease) 10/27/2014    Past Surgical History:  Procedure Laterality Date  . JOINT REPLACEMENT    . TOTAL HIP ARTHROPLASTY Left 10/27/2014  . TOTAL HIP ARTHROPLASTY Left 10/27/2014   Procedure: TOTAL HIP ARTHROPLASTY ANTERIOR APPROACH;  Surgeon: Renette Butters, MD;  Location: [facility];  Service: Orthopedics;  Laterality: Left;    Home Medications    Prior to Admission medications   Medication Sig Start Date End Date Taking? Authorizing Provider  aspirin 325 MG tablet Take 1 tablet (325 mg total) by mouth daily. 10/27/14   Lovett Calender, PA-C  docusate sodium (COLACE) 100 MG capsule  Take 1 capsule (100 mg total) by mouth 2 (two) times daily. 10/27/14   Lovett Calender, PA-C  HYDROcodone-acetaminophen (NORCO) 5-325 MG per tablet Take 1-2 tablets by mouth every 6 (six) hours as needed for moderate pain. 10/27/14   Lovett Calender, PA-C  methocarbamol (ROBAXIN) 500 MG tablet Take 1 tablet (500 mg total) by mouth 4 (four) times daily. 10/27/14   Lovett Calender, PA-C  ondansetron (ZOFRAN) 4 MG tablet Take 1 tablet (4 mg total) by mouth every 8 (eight) hours as needed for nausea or vomiting. 10/27/14   Lovett Calender, PA-C   Family History Family History  Problem Relation Age of Onset  . Cirrhosis Mother   . HIV/AIDS Father     Social History Social History  Substance Use Topics  . Smoking status: Current Every Day Smoker    Packs/day: 1.00    Years: 45.00    Types: Cigarettes  . Smokeless tobacco: Never Used  . Alcohol use 42.0 oz/week    70 Cans of beer per week     Comment: 10/27/2014 "2-3, 40oz beer qd"  Patient states he drinks one can of beer per per day and no illicit drug use   Allergies   Patient has no known allergies.   Review of Systems Review of Systems  Musculoskeletal: Positive for arthralgias.  All other systems reviewed and are negative.    Physical Exam Updated Vital Signs BP (!) 154/98 (BP Location: Right Arm)   Pulse 98   Temp 98.8 F (37.1 C) (Oral)  Resp 16   SpO2 99%   Physical Exam  Constitutional: He appears well-developed and well-nourished.  HENT:  Head: Normocephalic and atraumatic.  Eyes: Pupils are equal, round, and reactive to light. Conjunctivae are normal.  Neck: Neck supple. No tracheal deviation present. No thyromegaly present.  Cardiovascular: Normal rate and regular rhythm.   No murmur heard. Pulmonary/Chest: Effort normal and breath sounds normal.  Abdominal: Soft. Bowel sounds are normal. He exhibits no distension. There is no tenderness.  Musculoskeletal: Normal range of motion. He exhibits no edema or  tenderness.  Bilateral lower extremities with minimal swelling and tenderness and ankles. Not red or warm. DP pulses 2+ bilaterally. Good capillary refill These are not swollen or tender. Bilateral upper extremities without redness swelling or tenderness neurovascular intact. Radial pulse 2+ bilaterally. Good capillary refill  Neurological: He is alert. Coordination normal.  Skin: Skin is warm and dry. No rash noted.  Psychiatric: He has a normal mood and affect.  Nursing note and vitals reviewed.    ED Treatments / Results  DIAGNOSTIC STUDIES: Oxygen Saturation is 99% on RA, normal by my interpretation.   COORDINATION OF CARE: 4:35 PM-Discussed next steps with pt. Pt verbalized understanding and is agreeable with the plan.   Labs (all labs ordered are listed, but only abnormal results are displayed) Labs Reviewed - No data to display  EKG  EKG Interpretation None      Results for orders placed or performed during the hospital encounter of 11/27/16  Comprehensive metabolic panel  Result Value Ref Range   Sodium 126 (L) 135 - 145 mmol/L   Potassium 3.5 3.5 - 5.1 mmol/L   Chloride 93 (L) 101 - 111 mmol/L   CO2 24 22 - 32 mmol/L   Glucose, Bld 104 (H) 65 - 99 mg/dL   BUN 7 6 - 20 mg/dL   Creatinine, Ser 0.89 0.61 - 1.24 mg/dL   Calcium 9.0 8.9 - 10.3 mg/dL   Total Protein 8.0 6.5 - 8.1 g/dL   Albumin 3.0 (L) 3.5 - 5.0 g/dL   AST 49 (H) 15 - 41 U/L   ALT 18 17 - 63 U/L   Alkaline Phosphatase 51 38 - 126 U/L   Total Bilirubin 1.4 (H) 0.3 - 1.2 mg/dL   GFR calc non Af Amer >60 >60 mL/min   GFR calc Af Amer >60 >60 mL/min   Anion gap 9 5 - 15   No results found.  Radiology No results found.  Procedures Procedures (including critical care time)  Medications Ordered in ED Medications - No data to display  5:50 PM pain improved after treatment with Tylenol. He is alert and ambulates without difficulty.   Initial Impression / Assessment and Plan / ED Course  I  have reviewed the triage vital signs and the nursing notes.  Pertinent labs & imaging results that were available during my care of the patient were reviewed by me and considered in my medical decision making (see chart for details).   no signs of infection. Plan follow-up with Dr. Deforest Hoyles as outpatient. Blood pressure recheck. Serial sodium recheck    Final Clinical Impressions(s) / ED Diagnoses  Diagnosis #1 arthralgias #2 elevated blood pressure #3 hyponatremia #4 hyperbilirubinemia Final diagnoses:  None  #5 peripheral edema  New Prescriptions New Prescriptions   No medications on file     Orlie Dakin, MD 11/27/16 [place], [place], MD 11/27/16 2120

## 2016-12-22 ENCOUNTER — Ambulatory Visit (HOSPITAL_COMMUNITY)
Admission: EM | Admit: 2016-12-22 | Discharge: 2016-12-22 | Disposition: A | Discharge status: Home / Self Care | Payer: Self-pay | Attending: Family Medicine | Admitting: Family Medicine

## 2016-12-22 ENCOUNTER — Encounter (HOSPITAL_COMMUNITY): Payer: Self-pay | Admitting: *Deleted

## 2016-12-22 DIAGNOSIS — M1990 Unspecified osteoarthritis, unspecified site: Secondary | ICD-10-CM

## 2016-12-22 DIAGNOSIS — R600 Localized edema: Secondary | ICD-10-CM

## 2016-12-22 MED ORDER — KETOROLAC TROMETHAMINE 30 MG/ML IJ SOLN
30.0000 mg | Freq: Once | INTRAMUSCULAR | Status: AC
  Administered 2016-12-22: 30 mg via INTRAMUSCULAR

## 2016-12-22 MED ORDER — MELOXICAM 15 MG PO TABS: 15.0000 mg | ORAL_TABLET | Freq: Every day | ORAL | 0 refills | Status: DC

## 2016-12-22 MED ORDER — KETOROLAC TROMETHAMINE 30 MG/ML IJ SOLN
INTRAMUSCULAR | Status: AC
  Filled 2016-12-22: qty 1

## 2016-12-22 NOTE — ED Triage Notes (Signed)
Patient reports pain and swelling x 3 days to bilateral feet, ankles, knees, and wrist. On exam notable swelling to bilateral wrist. Patient works 3rd shift and is standing all night.

## 2016-12-22 NOTE — Discharge Instructions (Signed)
Wear Jobst stockings

## 2016-12-22 NOTE — ED Provider Notes (Signed)
  Seth Bell   [id number] 12/22/16 Arrival Time: 1010  ASSESSMENT & PLAN:  1. Arthritis   2. Lower extremity edema     Meds ordered this encounter  Medications  . ketorolac (TORADOL) 30 MG/ML injection 30 mg  . meloxicam (MOBIC) 15 MG tablet    Sig: Take 1 tablet (15 mg total) by mouth daily.    Dispense:  15 tablet    Refill:  0    Order Specific Question:   Supervising Provider    Answer:   Sherlene Shams [[id number]]   Discussed getting JOBST stockings bilateral  Reviewed expectations re: course of current medical issues. Questions answered. Outlined signs and symptoms indicating need for more acute intervention. Patient verbalized understanding. After Visit Summary given.   SUBJECTIVE:  Seth Bell is a 61 y.o. male who presents with complaint of lower extremity edema from standing at work all night. C/o swelling and pain in his hands.  ROS: As per HPI.   OBJECTIVE:  Vitals:   12/22/16 1034  BP: (!) 156/95  Pulse: 87  Resp: 16  Temp: 98.7 F (37.1 C)  TempSrc: Oral  SpO2: 100%     General appearance: alert; no distress HEENT: normocephalic; atraumatic; conjunctivae normal; TMs normal; nasal mucosa normal; oral mucosa normal Neck: supple Lungs: clear to auscultation bilaterally Heart: regular rate and rhythm Abdomen: soft, non-tender; bowel sounds normal; no masses or organomegaly; no guarding or rebound tenderness Back: no CVA tenderness Extremities: hands with swelling and TTP DIPS  Bilateral pedal and pre tibial edema Skin: warm and dry Neurologic: normal symmetric reflexes; normal gait Psychological:  alert and cooperative; normal mood and affect    Labs Reviewed - No data to display  No results found.  No Known Allergies  PMHx, SurgHx, SocialHx, Medications, and Allergies were reviewed in the Visit Navigator and updated as appropriate.      Lysbeth Penner, FNP 12/22/16 1152

## 2017-02-14 ENCOUNTER — Encounter (HOSPITAL_COMMUNITY): Payer: Self-pay

## 2017-02-14 ENCOUNTER — Emergency Department (HOSPITAL_COMMUNITY)
Admission: EM | Admit: 2017-02-14 | Discharge: 2017-02-14 | Disposition: A | Discharge status: Home / Self Care | Payer: Managed Care, Other (non HMO) | Attending: Emergency Medicine | Admitting: Emergency Medicine

## 2017-02-14 DIAGNOSIS — Z79899 Other long term (current) drug therapy: Secondary | ICD-10-CM | POA: Insufficient documentation

## 2017-02-14 DIAGNOSIS — Z7982 Long term (current) use of aspirin: Secondary | ICD-10-CM | POA: Insufficient documentation

## 2017-02-14 DIAGNOSIS — Z96642 Presence of left artificial hip joint: Secondary | ICD-10-CM | POA: Insufficient documentation

## 2017-02-14 DIAGNOSIS — K0889 Other specified disorders of teeth and supporting structures: Secondary | ICD-10-CM | POA: Insufficient documentation

## 2017-02-14 DIAGNOSIS — F17210 Nicotine dependence, cigarettes, uncomplicated: Secondary | ICD-10-CM | POA: Insufficient documentation

## 2017-02-14 MED ORDER — IBUPROFEN 400 MG PO TABS
600.0000 mg | ORAL_TABLET | Freq: Once | ORAL | Status: AC
  Administered 2017-02-14: 600 mg via ORAL
  Filled 2017-02-14: qty 1

## 2017-02-14 MED ORDER — AMOXICILLIN 500 MG PO CAPS: 500.0000 mg | ORAL_CAPSULE | Freq: Three times a day (TID) | ORAL | 0 refills | Status: DC

## 2017-02-14 NOTE — Discharge Instructions (Signed)
You have a dental infection It is very important that you get evaluated by a dentist as soon as possible. Call tomorrow to schedule an appointment. buprofen and tylenol as needed for pain. Take your full course of antibiotics. Read the instructions below.  Eat a soft or liquid diet and rinse your mouth out after meals with warm water. You should see a dentist or return here at once if you have increased swelling, increased pain or uncontrolled bleeding from the site of your injury.  SEEK MEDICAL CARE IF:  You have increased pain not controlled with medicines.  You have swelling around your tooth, in your face or neck.  You have bleeding which starts, continues, or gets worse.  You have a fever >101 If you are unable to open your mouth

## 2017-02-14 NOTE — ED Provider Notes (Signed)
[facility] DEPT Provider Note   CSN: [id number] Arrival date & time: 02/14/17  1407     History   Chief Complaint No chief complaint on file.   HPI Seth Bell is a 61 y.o. male.  HPI 61 year old Caucasian male presents to the ED today with complaints of left lower dental pain. Patient states he has a wisdom tooth that needs to be pulled. Patient does not have a dentist. Reports pain worse with palpation and eating or drinking. Denies any associated facial swelling, fevers, difficulties breathing, difficulty swallowing. Patient taking anything for his pain. Requesting referral to dentist. Past Medical History:  Diagnosis Date  . Arthritis    "left hip" (10/27/2014)  . Joint pain     Patient Active Problem List   Diagnosis Date Noted  . Primary osteoarthritis of left hip 10/28/2014  . DJD (degenerative joint disease) 10/27/2014    Past Surgical History:  Procedure Laterality Date  . JOINT REPLACEMENT    . TOTAL HIP ARTHROPLASTY Left 10/27/2014  . TOTAL HIP ARTHROPLASTY Left 10/27/2014   Procedure: TOTAL HIP ARTHROPLASTY ANTERIOR APPROACH;  Surgeon: Renette Butters, MD;  Location: [facility];  Service: Orthopedics;  Laterality: Left;       Home Medications    Prior to Admission medications   Medication Sig Start Date End Date Taking? Authorizing Provider  aspirin 325 MG tablet Take 1 tablet (325 mg total) by mouth daily. 10/27/14   Lovett Calender, PA-C  docusate sodium (COLACE) 100 MG capsule Take 1 capsule (100 mg total) by mouth 2 (two) times daily. 10/27/14   Lovett Calender, PA-C  HYDROcodone-acetaminophen (NORCO) 5-325 MG per tablet Take 1-2 tablets by mouth every 6 (six) hours as needed for moderate pain. 10/27/14   Lovett Calender, PA-C  meloxicam (MOBIC) 15 MG tablet Take 1 tablet (15 mg total) by mouth daily. 12/22/16   Lysbeth Penner, FNP  methocarbamol (ROBAXIN) 500 MG tablet Take 1 tablet (500 mg total) by mouth 4 (four) times daily. 10/27/14   Lovett Calender,  PA-C  ondansetron (ZOFRAN) 4 MG tablet Take 1 tablet (4 mg total) by mouth every 8 (eight) hours as needed for nausea or vomiting. 10/27/14   Lovett Calender, PA-C    Family History Family History  Problem Relation Age of Onset  . Cirrhosis Mother   . HIV/AIDS Father     Social History Social History  Substance Use Topics  . Smoking status: Current Every Day Smoker    Packs/day: 1.00    Years: 45.00    Types: Cigarettes  . Smokeless tobacco: Never Used  . Alcohol use 42.0 oz/week    70 Cans of beer per week     Comment: 10/27/2014 "2-3, 40oz beer qd"     Allergies   Patient has no known allergies.   Review of Systems Review of Systems  Constitutional: Negative for chills and fever.  HENT: Positive for dental problem. Negative for trouble swallowing.   Gastrointestinal: Negative for nausea and vomiting.  Musculoskeletal: Negative for neck pain and neck stiffness.     Physical Exam Updated Vital Signs BP 126/85   Pulse (!) 108   Temp 99.1 F (37.3 C) (Oral)   Resp 18   SpO2 99%   Physical Exam  Constitutional: He appears well-developed and well-nourished. No distress.  HENT:  Head: Normocephalic and atraumatic.  Mouth/Throat: Uvula is midline, oropharynx is clear and moist and mucous membranes are normal. No trismus in the jaw. No uvula swelling.    No  facial swelling. Oropharynx is clear. No sublingual or submandibular swelling. Managing secretions and tolerating his airway.  Eyes: Right eye exhibits no discharge. Left eye exhibits no discharge. No scleral icterus.  Neck: Normal range of motion. Neck supple.  No c spine midline tenderness. No paraspinal tenderness. No deformities or step offs noted. Full ROM. Supple. No nuchal rigidity.    Pulmonary/Chest: No respiratory distress.  Musculoskeletal: Normal range of motion.  Neurological: He is alert.  Skin: No pallor.  Psychiatric: His behavior is normal. Judgment and thought content normal.  Nursing note  and vitals reviewed.    ED Treatments / Results  Labs (all labs ordered are listed, but only abnormal results are displayed) Labs Reviewed - No data to display  EKG  EKG Interpretation None       Radiology No results found.  Procedures Procedures (including critical care time)  Medications Ordered in ED Medications - No data to display   Initial Impression / Assessment and Plan / ED Course  I have reviewed the triage vital signs and the nursing notes.  Pertinent labs & imaging results that were available during my care of the patient were reviewed by me and considered in my medical decision making (see chart for details).     Patient with toothache.  No gross abscess.  Exam unconcerning for Ludwig's angina or spread of infection.  Will treat with penicillin and pain medicine.  Urged patient to follow-up with dentist.     Final Clinical Impressions(s) / ED Diagnoses   Final diagnoses:  Pain, dental    New Prescriptions New Prescriptions   No medications on file     Aaron Edelman 02/14/17 1844    Sherwood Gambler, MD 02/14/17 [phone]

## 2017-02-14 NOTE — ED Triage Notes (Signed)
Patient here with 2 weeks of left lower dental pain, states that he needs his wisdom tooth pulled

## 2017-03-04 ENCOUNTER — Emergency Department (HOSPITAL_COMMUNITY)
Admission: EM | Admit: 2017-03-04 | Discharge: 2017-03-04 | Disposition: A | Discharge status: Home / Self Care | Payer: Managed Care, Other (non HMO) | Attending: Emergency Medicine | Admitting: Emergency Medicine

## 2017-03-04 ENCOUNTER — Encounter (HOSPITAL_COMMUNITY): Payer: Self-pay | Admitting: *Deleted

## 2017-03-04 DIAGNOSIS — M25561 Pain in right knee: Secondary | ICD-10-CM | POA: Insufficient documentation

## 2017-03-04 DIAGNOSIS — M25562 Pain in left knee: Secondary | ICD-10-CM | POA: Insufficient documentation

## 2017-03-04 DIAGNOSIS — M25542 Pain in joints of left hand: Secondary | ICD-10-CM | POA: Insufficient documentation

## 2017-03-04 DIAGNOSIS — M2550 Pain in unspecified joint: Secondary | ICD-10-CM

## 2017-03-04 DIAGNOSIS — M25571 Pain in right ankle and joints of right foot: Secondary | ICD-10-CM | POA: Insufficient documentation

## 2017-03-04 DIAGNOSIS — M25572 Pain in left ankle and joints of left foot: Secondary | ICD-10-CM | POA: Insufficient documentation

## 2017-03-04 DIAGNOSIS — Z96642 Presence of left artificial hip joint: Secondary | ICD-10-CM | POA: Insufficient documentation

## 2017-03-04 DIAGNOSIS — E876 Hypokalemia: Secondary | ICD-10-CM | POA: Insufficient documentation

## 2017-03-04 DIAGNOSIS — M25532 Pain in left wrist: Secondary | ICD-10-CM | POA: Insufficient documentation

## 2017-03-04 DIAGNOSIS — M25541 Pain in joints of right hand: Secondary | ICD-10-CM | POA: Insufficient documentation

## 2017-03-04 DIAGNOSIS — F17210 Nicotine dependence, cigarettes, uncomplicated: Secondary | ICD-10-CM | POA: Insufficient documentation

## 2017-03-04 DIAGNOSIS — M25531 Pain in right wrist: Secondary | ICD-10-CM | POA: Insufficient documentation

## 2017-03-04 LAB — CBC WITH DIFFERENTIAL/PLATELET
Basophils Absolute: 0 10*3/uL (ref 0.0–0.1)
Basophils Relative: 0 %
EOS ABS: 0 10*3/uL (ref 0.0–0.7)
Eosinophils Relative: 1 %
HEMATOCRIT: 30.4 % — AB (ref 39.0–52.0)
Hemoglobin: 10.5 g/dL — ABNORMAL LOW (ref 13.0–17.0)
LYMPHS ABS: 1.8 10*3/uL (ref 0.7–4.0)
Lymphocytes Relative: 41 %
MCH: 31.8 pg (ref 26.0–34.0)
MCHC: 34.5 g/dL (ref 30.0–36.0)
MCV: 92.1 fL (ref 78.0–100.0)
Monocytes Absolute: 0.2 10*3/uL (ref 0.1–1.0)
Monocytes Relative: 5 %
NEUTROS ABS: 2.3 10*3/uL (ref 1.7–7.7)
NEUTROS PCT: 53 %
Platelets: 590 10*3/uL — ABNORMAL HIGH (ref 150–400)
RBC: 3.3 MIL/uL — ABNORMAL LOW (ref 4.22–5.81)
RDW: 13.1 % (ref 11.5–15.5)
WBC: 4.3 10*3/uL (ref 4.0–10.5)

## 2017-03-04 LAB — COMPREHENSIVE METABOLIC PANEL
ALK PHOS: 48 U/L (ref 38–126)
ALT: 9 U/L — AB (ref 17–63)
AST: 24 U/L (ref 15–41)
Albumin: 2.4 g/dL — ABNORMAL LOW (ref 3.5–5.0)
Anion gap: 7 (ref 5–15)
BUN: 9 mg/dL (ref 6–20)
CHLORIDE: 100 mmol/L — AB (ref 101–111)
CO2: 23 mmol/L (ref 22–32)
CREATININE: 1.05 mg/dL (ref 0.61–1.24)
Calcium: 8.3 mg/dL — ABNORMAL LOW (ref 8.9–10.3)
GFR calc non Af Amer: >60 mL/min (ref >60)
Glucose, Bld: 106 mg/dL — ABNORMAL HIGH (ref 65–99)
Potassium: 2.9 mmol/L — ABNORMAL LOW (ref 3.5–5.1)
SODIUM: 130 mmol/L — AB (ref 135–145)
Total Bilirubin: 0.9 mg/dL (ref 0.3–1.2)
Total Protein: 7.3 g/dL (ref 6.5–8.1)

## 2017-03-04 LAB — C-REACTIVE PROTEIN: CRP: 5.4 mg/dL — ABNORMAL HIGH (ref <1.0)

## 2017-03-04 MED ORDER — TRAMADOL HCL 50 MG PO TABS: 50.0000 mg | ORAL_TABLET | Freq: Four times a day (QID) | ORAL | 0 refills | Status: DC | PRN

## 2017-03-04 MED ORDER — POTASSIUM CHLORIDE CRYS ER 20 MEQ PO TBCR
40.0000 meq | EXTENDED_RELEASE_TABLET | Freq: Once | ORAL | Status: AC
  Administered 2017-03-04: 40 meq via ORAL
  Filled 2017-03-04: qty 2

## 2017-03-04 MED ORDER — POTASSIUM CHLORIDE CRYS ER 20 MEQ PO TBCR: 20.0000 meq | EXTENDED_RELEASE_TABLET | Freq: Every day | ORAL | 0 refills | Status: DC

## 2017-03-04 MED ORDER — TRAMADOL HCL 50 MG PO TABS
50.0000 mg | ORAL_TABLET | Freq: Once | ORAL | Status: AC
  Administered 2017-03-04: 50 mg via ORAL
  Filled 2017-03-04: qty 1

## 2017-03-04 NOTE — ED Triage Notes (Signed)
Pt states joint pain and swelling for several weeks.  Has been seen here previously for same.  However, states weight loss and loss of appetite for several weeks, as well as increasing joint pain.

## 2017-03-04 NOTE — Discharge Instructions (Signed)
You were seen in the ED today with joint pain. Your labs show evidence of inflammation and you will need to follow up with your PCP and possible a Rheumatologist for further evaluation and treatment.   Your potassium was also slightly low. I am prescribing some potassium supplements to take and your PCP will need to follow up and repeat this blood work in the next 3-5 days.

## 2017-03-04 NOTE — ED Provider Notes (Signed)
Emergency Department Provider Note   I have reviewed the triage vital signs and the nursing notes.   HISTORY  Chief Complaint Joint Pain   HPI Seth Bell is a 61 y.o. male presents to the emergency department for evaluation of continuing joint pain that resulted in a fall today. The patient states he's had significant pain in his hands, wrists, ankles, and knees bilaterally. No associated rash, fever, or warmth over the joints. He denies any chest pain or difficult to breathing. His been seen in the emergency department 1-2 times for similar symptoms in the past. He states that he's been taking over-the-counter medications with little to no relief. He no longer follows with his primary care physician listed in Epic citing insurance issues. He has not seen any specialists regarding this problem.   Patient states he was standing up today when the right leg just "gave out." He fell to the ground and denies hitting his head or having loss of consciousness. No pain in the chest, palpitations, dyspnea prior to fall. He was able to get up under his own power and ambulate with continued joint discomfort. He did not drive himself to the ED.     Past Medical History:  Diagnosis Date  . Arthritis    "left hip" (10/27/2014)  . Joint pain     Patient Active Problem List   Diagnosis Date Noted  . Primary osteoarthritis of left hip 10/28/2014  . DJD (degenerative joint disease) 10/27/2014    Past Surgical History:  Procedure Laterality Date  . JOINT REPLACEMENT    . TOTAL HIP ARTHROPLASTY Left 10/27/2014  . TOTAL HIP ARTHROPLASTY Left 10/27/2014   Procedure: TOTAL HIP ARTHROPLASTY ANTERIOR APPROACH;  Surgeon: Renette Butters, MD;  Location: [facility];  Service: Orthopedics;  Laterality: Left;    Current Outpatient Rx  . Order #: [id number] Class: Print  . Order #: [id number] Class: Print  . Order #: [id number] Class: Print  . Order #: [id number] Class: Print  . Order #: [id number] Class: Normal    . Order #: [id number] Class: Print  . Order #: [id number] Class: Print  . Order #: [id number] Class: Print  . Order #: [id number] Class: Print    Allergies Patient has no known allergies.  Family History  Problem Relation Age of Onset  . Cirrhosis Mother   . HIV/AIDS Father     Social History Social History  Substance Use Topics  . Smoking status: Current Every Day Smoker    Packs/day: 1.50    Years: 45.00    Types: Cigarettes  . Smokeless tobacco: Never Used  . Alcohol use 42.0 oz/week    70 Cans of beer per week     Comment: drink 12 pack per day    Review of Systems  Constitutional: No fever/chills Eyes: No visual changes. ENT: No sore throat. Cardiovascular: Denies chest pain. Respiratory: Denies shortness of breath. Gastrointestinal: No abdominal pain.  No nausea, no vomiting.  No diarrhea.  No constipation. Genitourinary: Negative for dysuria. Musculoskeletal: Negative for back pain. Positive bilateral joint pain and swelling.  Skin: Negative for rash. Neurological: Negative for headaches, focal weakness or numbness.  10-point ROS otherwise negative.  ____________________________________________   PHYSICAL EXAM:  VITAL SIGNS: ED Triage Vitals  Enc Vitals Group     BP 03/04/17 1717 (!) 150/83     Pulse Rate 03/04/17 1717 93     Resp 03/04/17 1717 16     Temp 03/04/17 1717 97.9 F (36.6 C)     Temp Source 03/04/17 1717 Oral  SpO2 03/04/17 1717 100 %     Weight 03/04/17 1718 116 lb (52.6 kg)     Height 03/04/17 1718 5\' 11"  (1.803 m)     Pain Score 03/04/17 1717 10   Constitutional: Alert and oriented. Well appearing and in no acute distress. Eyes: Conjunctivae are normal.  Head: Atraumatic. Nose: No congestion/rhinnorhea. Mouth/Throat: Mucous membranes are moist.  Neck: No stridor. No cervical spine tenderness.  Cardiovascular: Normal rate, regular rhythm. Good peripheral circulation. Grossly normal heart sounds.   Respiratory: Normal respiratory  effort.  No retractions. Lungs CTAB. Gastrointestinal: Soft and nontender. No distention.  Musculoskeletal: No lower extremity edema. Does have bilateral swelling in the ankles, wrists, and fingers. Full passive and active ROM in the knees and hips. No significant joint redness or warmth.  Neurologic:  Normal speech and language. No gross focal neurologic deficits are appreciated.  Skin:  Skin is warm, dry and intact. No rash noted.  ____________________________________________   LABS (all labs ordered are listed, but only abnormal results are displayed)  Labs Reviewed  COMPREHENSIVE METABOLIC PANEL - Abnormal; Notable for the following:       Result Value   Sodium 130 (*)    Potassium 2.9 (*)    Chloride 100 (*)    Glucose, Bld 106 (*)    Calcium 8.3 (*)    Albumin 2.4 (*)    ALT 9 (*)    All other components within normal limits  CBC WITH DIFFERENTIAL/PLATELET - Abnormal; Notable for the following:    RBC 3.30 (*)    Hemoglobin 10.5 (*)    HCT 30.4 (*)    Platelets 590 (*)    All other components within normal limits  C-REACTIVE PROTEIN - Abnormal; Notable for the following:    CRP 5.4 (*)    All other components within normal limits   ____________________________________________  RADIOLOGY  None ____________________________________________   PROCEDURES  Procedure(s) performed:   Procedures  None ____________________________________________   INITIAL IMPRESSION / ASSESSMENT AND PLAN / ED COURSE  Pertinent labs & imaging results that were available during my care of the patient were reviewed by me and considered in my medical decision making (see chart for details).  Patient resents emergency pertinent for evaluation of continued joint pain and swelling with a new fall today. Patient has full range of motion of bilateral hips and knees on exam. Joint swelling is bilateral. No evidence of septic joint on my exam. Plan for baseline labs including CRP which I  suspect will be elevated. I discussed with the patient that we'll try and control his symptoms and started basic workup here but that he will require follow-up with the primary care physician and likely referral to rheumatology if initial testing and treatment are negative.   Initial labs largely unremarkable with the exception of CRP. No unilateral joint swelling or clinical signs/symptoms of septic joint. I suspect an underlying rheumatologic etiology and discussed my impression in detail with the patient. Discussed that he will need additional labs from his PCP and possible referral to Rheumatology. Provided additional PCP contact information. Discussed ED return precautions in detail.   At this time, I do not feel there is any life-threatening condition present. I have reviewed and discussed all results (EKG, imaging, lab, urine as appropriate), exam findings with patient. I have reviewed nursing notes and appropriate previous records.  I feel the patient is safe to be discharged home without further emergent workup. Discussed usual and customary return precautions. Patient and family (  if present) verbalize understanding and are comfortable with this plan.  Patient will follow-up with their primary care provider. If they do not have a primary care provider, information for follow-up has been provided to them. All questions have been answered.  ____________________________________________  FINAL CLINICAL IMPRESSION(S) / ED DIAGNOSES  Final diagnoses:  Arthralgia, unspecified joint  Hypokalemia     MEDICATIONS GIVEN DURING THIS VISIT:  Medications  traMADol (ULTRAM) tablet 50 mg (50 mg Oral Given 03/04/17 1931)  potassium chloride SA (K-DUR,KLOR-CON) CR tablet 40 mEq (40 mEq Oral Given 03/04/17 2031)     NEW OUTPATIENT MEDICATIONS STARTED DURING THIS VISIT:  Discharge Medication List as of 03/04/2017  8:08 PM    START taking these medications   Details  potassium chloride SA  (K-DUR,KLOR-CON) 20 MEQ tablet Take 1 tablet (20 mEq total) by mouth daily., Starting Sun 03/04/2017, Until Thu 03/08/2017, Print    traMADol (ULTRAM) 50 MG tablet Take 1 tablet (50 mg total) by mouth every 6 (six) hours as needed., Starting Sun 03/04/2017, Print        Note:  This document was prepared using Dragon voice recognition software and may include unintentional dictation errors.  Nanda Quinton, MD Emergency Medicine    Shawnika Pepin, [facility], MD 03/05/17 [phone]

## 2017-03-12 ENCOUNTER — Ambulatory Visit: Payer: Self-pay | Attending: Internal Medicine | Admitting: Physician Assistant

## 2017-03-12 VITALS — BP 131/87 | HR 109 | Temp 97.7°F | Resp 18 | Ht 71.0 in | Wt 123.0 lb

## 2017-03-12 DIAGNOSIS — Z79899 Other long term (current) drug therapy: Secondary | ICD-10-CM | POA: Insufficient documentation

## 2017-03-12 DIAGNOSIS — E871 Hypo-osmolality and hyponatremia: Secondary | ICD-10-CM | POA: Insufficient documentation

## 2017-03-12 DIAGNOSIS — E878 Other disorders of electrolyte and fluid balance, not elsewhere classified: Secondary | ICD-10-CM | POA: Insufficient documentation

## 2017-03-12 DIAGNOSIS — M2550 Pain in unspecified joint: Secondary | ICD-10-CM | POA: Insufficient documentation

## 2017-03-12 DIAGNOSIS — D649 Anemia, unspecified: Secondary | ICD-10-CM | POA: Insufficient documentation

## 2017-03-12 DIAGNOSIS — E876 Hypokalemia: Secondary | ICD-10-CM | POA: Insufficient documentation

## 2017-03-12 DIAGNOSIS — Z7982 Long term (current) use of aspirin: Secondary | ICD-10-CM | POA: Insufficient documentation

## 2017-03-12 MED ORDER — TRAMADOL HCL 50 MG PO TABS: 50.0000 mg | ORAL_TABLET | Freq: Four times a day (QID) | ORAL | 0 refills | Status: DC | PRN

## 2017-03-12 MED FILL — traMADol HCL 50 MG TABS: 50 | 7 days supply | Qty: 30 | Fill #0

## 2017-03-12 NOTE — Progress Notes (Signed)
Patient ID: Seth Bell, male   DOB: 01/06/1956, 61 y.o.   MRN: [id number]    Kyden Potash, is a 61 y.o. male  TKZ:[id number]  TDD:[id number]  DOB - 09-17-1955  Subjective:  Chief Complaint and HPI: Seth Bell is a 61 y.o. male here today to establish care and for a follow up visit After being seen in the ED 03/04/2017 for joint pain.  His R leg has "given out" and he sustained a fall PTA in the ED.  He had elevated CRP, hyponatremia, hypokalemia, and anemia on labs.  He presents c/o 3 week h/o B ankle, B wrist, and B knee pain.  NKI.  No tick bites.  No recent travel.  Appetite is poor.  Tramadol helps some.  Pain is aching and moderate.  No precipitating factors.    ED/Hospital notes reviewed.   Drinks a lot of alcohol Family history:  Mom died of cirrhosis, dad died of AIDS  ROS:   Constitutional:  No f/c, No night sweats, No unexplained weight loss. EENT:  No vision changes, No blurry vision, No hearing changes. No mouth, throat, or ear problems.  Respiratory: No cough, No SOB Cardiac: No CP, no palpitations GI:  No abd pain, No N/V/D. GU: No Urinary s/sx Musculoskeletal: + joint pain Neuro: No headache, no dizziness, no motor weakness.  Skin: No rash Endocrine:  No polydipsia. No polyuria.  Psych: Denies SI/HI  No problems updated.  ALLERGIES: No Known Allergies  PAST MEDICAL HISTORY: Past Medical History:  Diagnosis Date  . Arthritis    "left hip" (10/27/2014)  . Joint pain     MEDICATIONS AT HOME: Prior to Admission medications   Medication Sig Start Date End Date Taking? Authorizing Provider  amoxicillin (AMOXIL) 500 MG capsule Take 1 capsule (500 mg total) by mouth 3 (three) times daily. 02/14/17   Doristine Devoid, PA-C  aspirin 325 MG tablet Take 1 tablet (325 mg total) by mouth daily. 10/27/14   Lovett Calender, PA-C  docusate sodium (COLACE) 100 MG capsule Take 1 capsule (100 mg total) by mouth 2 (two) times daily. 10/27/14   Lovett Calender, PA-C    meloxicam (MOBIC) 15 MG tablet Take 1 tablet (15 mg total) by mouth daily. 12/22/16   Lysbeth Penner, FNP  methocarbamol (ROBAXIN) 500 MG tablet Take 1 tablet (500 mg total) by mouth 4 (four) times daily. 10/27/14   Lovett Calender, PA-C  ondansetron (ZOFRAN) 4 MG tablet Take 1 tablet (4 mg total) by mouth every 8 (eight) hours as needed for nausea or vomiting. 10/27/14   Lovett Calender, PA-C  potassium chloride SA (K-DUR,KLOR-CON) 20 MEQ tablet Take 1 tablet (20 mEq total) by mouth daily. 03/04/17 03/08/17  Long, Wonda Olds, MD  traMADol (ULTRAM) 50 MG tablet Take 1 tablet (50 mg total) by mouth every 6 (six) hours as needed. 03/12/17   Argentina Donovan, PA-C     Objective:  EXAM:   Vitals:   03/12/17 0930  BP: 131/87  Pulse: (!) 109  Resp: 18  Temp: 97.7 F (36.5 C)  TempSrc: Oral  SpO2: 99%  Weight: 123 lb (55.8 kg)  Height: 5\' 11"  (1.803 m)    General appearance : A&OX3. NAD. Non-toxic-appearing, appears older than stated age 59: Atraumatic and Normocephalic.  PERRLA. EOM intact.  TM clear B. Mouth-MMM, post pharynx WNL w/o erythema, No PND. Very poor dentition, plaque,dental caries.   Neck: supple, no JVD. No cervical lymphadenopathy. No thyromegaly Chest/Lungs:  Breathing-non-labored, Good air entry bilaterally,  breath sounds normal without rales, rhonchi, or wheezing  CVS: S1 S2 regular, no murmurs, gallops, rubs  Extremities: Bilateral Lower Ext shows no edema, both legs are warm to touch with = pulse throughout.  B wrists, ankles, and knees appear slightly swollen.  S/ROM WNL.  No erythema/signs of sepsis Neurology:  CN II-XII grossly intact, Non focal.   Psych:  TP linear. J/I WNL. Normal speech. Appropriate eye contact and affect.  Skin:  No Rash  Data Review No results found for: HGBA1C   Assessment & Plan   1. Arthralgia, unspecified joint Uncertain etiology.  Concern for malnutrition based on weight and dentition.  ? Alcohol related. Cessation recommended.    - Sedimentation Rate - Ambulatory referral to Rheumatology - TSH - traMADol (ULTRAM) 50 MG tablet; Take 1 tablet (50 mg total) by mouth every 6 (six) hours as needed.  Dispense: 30 tablet; Refill: 0  2. Hyponatremia - Comprehensive metabolic panel - Ambulatory referral to Rheumatology  3. Hypokalemia - Comprehensive metabolic panel - Ambulatory referral to Rheumatology  4. Anemia, unspecified type Will need GI referral (for anemia and has never had colonoscopy) - CBC with Differential/Platelet - Ambulatory referral to Rheumatology - Iron, TIBC and Ferritin Panel   Patient have been counseled extensively about nutrition and exercise.  Financial packet given.   Return in about 2 weeks (around 03/26/2017) for ASsign PCP: continue work-up for electrolyte abnormalities, joint pain, anemia.  The patient was given clear instructions to go to ER or return to medical center if symptoms don't improve, worsen or new problems develop. The patient verbalized understanding. The patient was told to call to get lab results if they haven't heard anything in the next week.   Freeman Caldron, PA-C [facility] [place], [place]   03/12/2017, 9:43 AM

## 2017-03-13 ENCOUNTER — Other Ambulatory Visit: Payer: Self-pay | Admitting: Physician Assistant

## 2017-03-13 LAB — COMPREHENSIVE METABOLIC PANEL
ALK PHOS: 54 IU/L (ref 39–117)
ALT: 7 IU/L (ref 0–44)
AST: 28 IU/L (ref 0–40)
Albumin/Globulin Ratio: 0.6 — ABNORMAL LOW (ref 1.2–2.2)
Albumin: 2.9 g/dL — ABNORMAL LOW (ref 3.6–4.8)
BILIRUBIN TOTAL: 1 mg/dL (ref 0.0–1.2)
BUN/Creatinine Ratio: 9 — ABNORMAL LOW (ref 10–24)
BUN: 9 mg/dL (ref 8–27)
CHLORIDE: 92 mmol/L — AB (ref 96–106)
CO2: 22 mmol/L (ref 20–29)
CREATININE: 0.97 mg/dL (ref 0.76–1.27)
Calcium: 8.8 mg/dL (ref 8.6–10.2)
GFR calc Af Amer: 96 mL/min/{1.73_m2} (ref >59)
GFR calc non Af Amer: 83 mL/min/{1.73_m2} (ref >59)
Globulin, Total: 4.8 g/dL — ABNORMAL HIGH (ref 1.5–4.5)
Glucose: 110 mg/dL — ABNORMAL HIGH (ref 65–99)
Potassium: 3.9 mmol/L (ref 3.5–5.2)
Sodium: 131 mmol/L — ABNORMAL LOW (ref 134–144)
Total Protein: 7.7 g/dL (ref 6.0–8.5)

## 2017-03-13 LAB — CBC WITH DIFFERENTIAL/PLATELET
BASOS ABS: 0 10*3/uL (ref 0.0–0.2)
Basos: 0 %
EOS (ABSOLUTE): 0 10*3/uL (ref 0.0–0.4)
Eos: 0 %
Hematocrit: 33.7 % — ABNORMAL LOW (ref 37.5–51.0)
Hemoglobin: 11.2 g/dL — ABNORMAL LOW (ref 13.0–17.7)
IMMATURE GRANS (ABS): 0 10*3/uL (ref 0.0–0.1)
IMMATURE GRANULOCYTES: 0 %
LYMPHS: 42 %
Lymphocytes Absolute: 2 10*3/uL (ref 0.7–3.1)
MCH: 31.5 pg (ref 26.6–33.0)
MCHC: 33.2 g/dL (ref 31.5–35.7)
MCV: 95 fL (ref 79–97)
Monocytes Absolute: 0.3 10*3/uL (ref 0.1–0.9)
Monocytes: 6 %
Neutrophils Absolute: 2.4 10*3/uL (ref 1.4–7.0)
Neutrophils: 52 %
PLATELETS: 636 10*3/uL — AB (ref 150–379)
RBC: 3.55 x10E6/uL — ABNORMAL LOW (ref 4.14–5.80)
RDW: 13.5 % (ref 12.3–15.4)
WBC: 4.7 10*3/uL (ref 3.4–10.8)

## 2017-03-13 LAB — IRON,TIBC AND FERRITIN PANEL
Ferritin: 247 ng/mL (ref 30–400)
Iron Saturation: 11 % — ABNORMAL LOW (ref 15–55)
Iron: 20 ug/dL — ABNORMAL LOW (ref 38–169)
TIBC: 174 ug/dL — AB (ref 250–450)
UIBC: 154 ug/dL (ref 111–343)

## 2017-03-13 LAB — TSH: TSH: 2.72 u[IU]/mL (ref 0.450–4.500)

## 2017-03-13 LAB — VITAMIN B12: VITAMIN B 12: 469 pg/mL (ref 232–1245)

## 2017-03-13 LAB — SEDIMENTATION RATE: SED RATE: 61 mm/h — AB (ref 0–30)

## 2017-03-13 MED ORDER — FERROUS SULFATE 325 (65 FE) MG PO TABS: 325.0000 mg | ORAL_TABLET | Freq: Every day | ORAL | 3 refills | Status: DC

## 2017-03-26 ENCOUNTER — Ambulatory Visit: Payer: Self-pay | Attending: Nurse Practitioner | Admitting: Nurse Practitioner

## 2017-03-26 ENCOUNTER — Encounter: Payer: Self-pay | Admitting: Nurse Practitioner

## 2017-03-26 VITALS — BP 107/73 | HR 99 | Temp 97.7°F | Resp 18 | Ht 71.0 in | Wt 119.6 lb

## 2017-03-26 DIAGNOSIS — Z791 Long term (current) use of non-steroidal anti-inflammatories (NSAID): Secondary | ICD-10-CM | POA: Insufficient documentation

## 2017-03-26 DIAGNOSIS — Z8489 Family history of other specified conditions: Secondary | ICD-10-CM | POA: Insufficient documentation

## 2017-03-26 DIAGNOSIS — Z79891 Long term (current) use of opiate analgesic: Secondary | ICD-10-CM | POA: Insufficient documentation

## 2017-03-26 DIAGNOSIS — Z830 Family history of human immunodeficiency virus [HIV] disease: Secondary | ICD-10-CM | POA: Insufficient documentation

## 2017-03-26 DIAGNOSIS — M25531 Pain in right wrist: Secondary | ICD-10-CM | POA: Insufficient documentation

## 2017-03-26 DIAGNOSIS — Z96642 Presence of left artificial hip joint: Secondary | ICD-10-CM | POA: Insufficient documentation

## 2017-03-26 DIAGNOSIS — D508 Other iron deficiency anemias: Secondary | ICD-10-CM | POA: Insufficient documentation

## 2017-03-26 DIAGNOSIS — Z79899 Other long term (current) drug therapy: Secondary | ICD-10-CM | POA: Insufficient documentation

## 2017-03-26 DIAGNOSIS — M25532 Pain in left wrist: Secondary | ICD-10-CM | POA: Insufficient documentation

## 2017-03-26 DIAGNOSIS — Z7982 Long term (current) use of aspirin: Secondary | ICD-10-CM | POA: Insufficient documentation

## 2017-03-26 DIAGNOSIS — F17210 Nicotine dependence, cigarettes, uncomplicated: Secondary | ICD-10-CM | POA: Insufficient documentation

## 2017-03-26 MED ORDER — PREDNISONE 20 MG PO TABS: 20.0000 mg | ORAL_TABLET | Freq: Every day | ORAL | 0 refills | Status: DC

## 2017-03-26 MED ORDER — PREDNISONE 20 MG PO TABS: 20.0000 mg | ORAL_TABLET | Freq: Every day | ORAL | 0 refills | Status: AC

## 2017-03-26 MED FILL — predniSONE 20 MG TABS: 20 | 7 days supply | Qty: 7 | Fill #0

## 2017-03-26 NOTE — Patient Instructions (Addendum)
Iron Deficiency Anemia, Adult Iron-deficiency anemia is when you have a low amount of red blood cells or hemoglobin. This happens because you have too little iron in your body. Hemoglobin carries oxygen to parts of the body. Anemia can cause your body to not get enough oxygen. It may or may not cause symptoms. Follow these instructions at home: Medicines  Take over-the-counter and prescription medicines only as told by your doctor. This includes iron pills (supplements) and vitamins.  If you cannot handle taking iron pills by mouth, ask your doctor about getting iron through: ? A vein (intravenously). ? A shot (injection) into a muscle.  Take iron pills when your stomach is empty. If you cannot handle this, take them with food.  Do not drink milk or take antacids at the same time as your iron pills.  To prevent trouble pooping (constipation), eat fiber or take medicine (stool softener) as told by your doctor. Eating and drinking  Talk with your doctor before changing the foods you eat. He or she may tell you to eat foods that have a lot of iron, such as: ? Liver. ? Lowfat (lean) beef. ? Breads and cereals that have iron added to them (fortified breads and cereals). ? Eggs. ? Dried fruit. ? Dark green, leafy vegetables.  Drink enough fluid to keep your pee (urine) clear or pale yellow.  Eat fresh fruits and vegetables that are high in vitamin C. They help your body to use iron. Foods with a lot of vitamin C include: ? Oranges. ? Peppers. ? Tomatoes. ? Mangoes. General instructions  Return to your normal activities as told by your doctor. Ask your doctor what activities are safe for you.  Keep yourself clean, and keep things clean around you (your surroundings). Anemia can make you get sick more easily.  Keep all follow-up visits as told by your doctor. This is important. Contact a doctor if:  You feel sick to your stomach (nauseous).  You throw up (vomit).  You feel  weak.  You are sweating for no clear reason.  You have trouble pooping, such as: ? Pooping (having a bowel movement) less than 3 times a week. ? Straining to poop. ? Having poop that is hard, dry, or larger than normal. ? Feeling full or bloated. ? Pain in the lower belly. ? Not feeling better after pooping. Get help right away if:  You pass out (faint). If this happens, do not drive yourself to the hospital. Call your local emergency services (911 in the [place]  You have chest pain.  You have shortness of breath that: ? Is very bad. ? Gets worse with physical activity.  You have a fast heartbeat.  You get light-headed when getting up from sitting or lying down. This information is not intended to replace advice given to you by your health care provider. Make sure you discuss any questions you have with your health care provider. Document Released: 06/03/2010 Document Revised: 01/19/2016 Document Reviewed: 01/19/2016 Elsevier Interactive Patient Education  2017 [employer].  Iron Deficiency Anemia, Adult Iron-deficiency anemia is when you have a low amount of red blood cells or hemoglobin. This happens because you have too little iron in your body. Hemoglobin carries oxygen to parts of the body. Anemia can cause your body to not get enough oxygen. It may or may not cause symptoms. Follow these instructions at home: Medicines  Take over-the-counter and prescription medicines only as told by your doctor. This includes iron pills (supplements) and  vitamins.  If you cannot handle taking iron pills by mouth, ask your doctor about getting iron through: ? A vein (intravenously). ? A shot (injection) into a muscle.  Take iron pills when your stomach is empty. If you cannot handle this, take them with food.  Do not drink milk or take antacids at the same time as your iron pills.  To prevent trouble pooping (constipation), eat fiber or take medicine (stool softener) as told by your  doctor. Eating and drinking  Talk with your doctor before changing the foods you eat. He or she may tell you to eat foods that have a lot of iron, such as: ? Liver. ? Lowfat (lean) beef. ? Breads and cereals that have iron added to them (fortified breads and cereals). ? Eggs. ? Dried fruit. ? Dark green, leafy vegetables.  Drink enough fluid to keep your pee (urine) clear or pale yellow.  Eat fresh fruits and vegetables that are high in vitamin C. They help your body to use iron. Foods with a lot of vitamin C include: ? Oranges. ? Peppers. ? Tomatoes. ? Mangoes. General instructions  Return to your normal activities as told by your doctor. Ask your doctor what activities are safe for you.  Keep yourself clean, and keep things clean around you (your surroundings). Anemia can make you get sick more easily.  Keep all follow-up visits as told by your doctor. This is important. Contact a doctor if:  You feel sick to your stomach (nauseous).  You throw up (vomit).  You feel weak.  You are sweating for no clear reason.  You have trouble pooping, such as: ? Pooping (having a bowel movement) less than 3 times a week. ? Straining to poop. ? Having poop that is hard, dry, or larger than normal. ? Feeling full or bloated. ? Pain in the lower belly. ? Not feeling better after pooping. Get help right away if:  You pass out (faint). If this happens, do not drive yourself to the hospital. Call your local emergency services (911 in the [place]  You have chest pain.  You have shortness of breath that: ? Is very bad. ? Gets worse with physical activity.  You have a fast heartbeat.  You get light-headed when getting up from sitting or lying down. This information is not intended to replace advice given to you by your health care provider. Make sure you discuss any questions you have with your health care provider. Document Released: 06/03/2010 Document Revised: 01/19/2016 Document  Reviewed: 01/19/2016 Elsevier Interactive Patient Education  2017 [employer].  Iron Deficiency Anemia, Adult Iron-deficiency anemia is when you have a low amount of red blood cells or hemoglobin. This happens because you have too little iron in your body. Hemoglobin carries oxygen to parts of the body. Anemia can cause your body to not get enough oxygen. It may or may not cause symptoms. Follow these instructions at home: Medicines  Take over-the-counter and prescription medicines only as told by your doctor. This includes iron pills (supplements) and vitamins.  If you cannot handle taking iron pills by mouth, ask your doctor about getting iron through: ? A vein (intravenously). ? A shot (injection) into a muscle.  Take iron pills when your stomach is empty. If you cannot handle this, take them with food.  Do not drink milk or take antacids at the same time as your iron pills.  To prevent trouble pooping (constipation), eat fiber or take medicine (stool softener) as told  by your doctor. Eating and drinking  Talk with your doctor before changing the foods you eat. He or she may tell you to eat foods that have a lot of iron, such as: ? Liver. ? Lowfat (lean) beef. ? Breads and cereals that have iron added to them (fortified breads and cereals). ? Eggs. ? Dried fruit. ? Dark green, leafy vegetables.  Drink enough fluid to keep your pee (urine) clear or pale yellow.  Eat fresh fruits and vegetables that are high in vitamin C. They help your body to use iron. Foods with a lot of vitamin C include: ? Oranges. ? Peppers. ? Tomatoes. ? Mangoes. General instructions  Return to your normal activities as told by your doctor. Ask your doctor what activities are safe for you.  Keep yourself clean, and keep things clean around you (your surroundings). Anemia can make you get sick more easily.  Keep all follow-up visits as told by your doctor. This is important. Contact a doctor  if:  You feel sick to your stomach (nauseous).  You throw up (vomit).  You feel weak.  You are sweating for no clear reason.  You have trouble pooping, such as: ? Pooping (having a bowel movement) less than 3 times a week. ? Straining to poop. ? Having poop that is hard, dry, or larger than normal. ? Feeling full or bloated. ? Pain in the lower belly. ? Not feeling better after pooping. Get help right away if:  You pass out (faint). If this happens, do not drive yourself to the hospital. Call your local emergency services (911 in the [place]  You have chest pain.  You have shortness of breath that: ? Is very bad. ? Gets worse with physical activity.  You have a fast heartbeat.  You get light-headed when getting up from sitting or lying down. This information is not intended to replace advice given to you by your health care provider. Make sure you discuss any questions you have with your health care provider. Document Released: 06/03/2010 Document Revised: 01/19/2016 Document Reviewed: 01/19/2016 Elsevier Interactive Patient Education  2017 [employer]  Iron-Rich Diet Iron is a mineral that helps your body to produce hemoglobin. Hemoglobin is a protein in your red blood cells that carries oxygen to your body's tissues. Eating too little iron may cause you to feel weak and tired, and it can increase your risk for infection. Eating enough iron is necessary for your body's metabolism, muscle function, and nervous system. Iron is naturally found in many foods. It can also be added to foods or fortified in foods. There are two types of dietary iron:  Heme iron. Heme iron is absorbed by the body more easily than nonheme iron. Heme iron is found in meat, poultry, and fish.  Nonheme iron. Nonheme iron is found in dietary supplements, iron-fortified grains, beans, and vegetables.  You may need to follow an iron-rich diet if:  You have been diagnosed with iron deficiency or  iron-deficiency anemia.  You have a condition that prevents you from absorbing dietary iron, such as: ? Infection in your intestines. ? Celiac disease. This involves long-lasting (chronic) inflammation of your intestines.  You do not eat enough iron.  You eat a diet that is high in foods that impair iron absorption.  You have lost a lot of blood.  You have heavy bleeding during your menstrual cycle.  You are pregnant.  What is my plan? Your health care provider may help you to determine how  much iron you need per day based on your condition. Generally, when a person consumes sufficient amounts of iron in the diet, the following iron needs are met:  Men. ? 29-28 years old: 11 mg per day. ? 64-79 years old: 8 mg per day.  Women. ? 75-59 years old: 15 mg per day. ? 76-5 years old: 18 mg per day. ? Over 70 years old: 8 mg per day. ? Pregnant women: 27 mg per day. ? Breastfeeding women: 9 mg per day.  What do I need to know about an iron-rich diet?  Eat fresh fruits and vegetables that are high in vitamin C along with foods that are high in iron. This will help increase the amount of iron that your body absorbs from food, especially with foods containing nonheme iron. Foods that are high in vitamin C include oranges, peppers, tomatoes, and mango.  Take iron supplements only as directed by your health care provider. Overdose of iron can be life-threatening. If you were prescribed iron supplements, take them with orange juice or a vitamin C supplement.  Cook foods in pots and pans that are made from iron.  Eat nonheme iron-containing foods alongside foods that are high in heme iron. This helps to improve your iron absorption.  Certain foods and drinks contain compounds that impair iron absorption. Avoid eating these foods in the same meal as iron-rich foods or with iron supplements. These include: ? Coffee, black tea, and red wine. ? Milk, dairy products, and foods that are high  in calcium. ? Beans, soybeans, and peas. ? Whole grains.  When eating foods that contain both nonheme iron and compounds that impair iron absorption, follow these tips to absorb iron better. ? Soak beans overnight before cooking. ? Soak whole grains overnight and drain them before using. ? Ferment flours before baking, such as using yeast in bread dough. What foods can I eat? Grains Iron-fortified breakfast cereal. Iron-fortified whole-wheat bread. Enriched rice. Sprouted grains. Vegetables Spinach. Potatoes with skin. Green peas. Broccoli. Red and green bell peppers. Fermented vegetables. Fruits Prunes. Raisins. Oranges. Strawberries. Mango. Grapefruit. Meats and Other Protein Sources Beef liver. Oysters. Beef. Shrimp. [place]. Chicken. [place]. Sardines. Chickpeas. Nuts. Tofu. Beverages Tomato juice. Fresh orange juice. Prune juice. Hibiscus tea. Fortified instant breakfast shakes. Condiments Tahini. Fermented soy sauce. Sweets and Desserts Black-strap molasses. Other Wheat germ. The items listed above may not be a complete list of recommended foods or beverages. Contact your dietitian for more options. What foods are not recommended? Grains Whole grains. Bran cereal. Bran flour. Oats. Vegetables Artichokes. Brussels sprouts. Kale. Fruits Blueberries. Raspberries. Strawberries. Figs. Meats and Other Protein Sources Soybeans. Products made from soy protein. Dairy Milk. Cream. Cheese. Yogurt. Cottage cheese. Beverages Coffee. Black tea. Red wine. Sweets and Desserts Cocoa. Chocolate. Ice cream. Other Basil. Oregano. Parsley. The items listed above may not be a complete list of foods and beverages to avoid. Contact your dietitian for more information. This information is not intended to replace advice given to you by your health care provider. Make sure you discuss any questions you have with your health care provider. Document Released: 12/13/2004 Document Revised: 11/19/2015  Document Reviewed: 11/26/2013 Elsevier Interactive Patient Education  2018 [place]  Arthritis Arthritis means joint pain. It can also mean joint disease. A joint is a place where bones come together. People who have arthritis may have:  Red joints.  Swollen joints.  Stiff joints.  Warm joints.  A fever.  A feeling of being sick.  Follow these instructions at home: Pay attention to any changes in your symptoms. Take these actions to help with your pain and swelling. Medicines  Take over-the-counter and prescription medicines only as told by your doctor.  Do not take aspirin for pain if your doctor says that you may have gout. Activity  Rest your joint if your doctor tells you to.  Avoid activities that make the pain worse.  Exercise your joint regularly as told by your doctor. Try doing exercises like: ? Swimming. ? Water aerobics. ? Biking. ? Walking. Joint Care   If your joint is swollen, keep it raised (elevated) if told by your doctor.  If your joint feels stiff in the morning, try taking a warm shower.  If you have diabetes, do not apply heat without asking your doctor.  If told, apply heat to the joint: ? Put a towel between the joint and the hot pack or heating pad. ? Leave the heat on the area for 20-30 minutes.  If told, apply ice to the joint: ? Put ice in a plastic bag. ? Place a towel between your skin and the bag. ? Leave the ice on for 20 minutes, 2-3 times per day.  Keep all follow-up visits as told by your doctor. Contact a doctor if:  The pain gets worse.  You have a fever. Get help right away if:  You have very bad pain in your joint.  You have swelling in your joint.  Your joint is red.  Many joints become painful and swollen.  You have very bad back pain.  Your leg is very weak.  You cannot control your pee (urine) or poop (stool). This information is not intended to replace advice given to you by your health care  provider. Make sure you discuss any questions you have with your health care provider. Document Released: 07/26/2009 Document Revised: 10/07/2015 Document Reviewed: 07/27/2014 Elsevier Interactive Patient Education  2018 [place] [employer] Joint pain can be caused by many things. The joint can be bruised, infected, weak from aging, or sore from exercise. The pain will probably go away if you follow your doctor's instructions for home care. If your joint pain continues, more tests may be needed to help find the cause of your condition. Follow these instructions at home: Watch your condition for any changes. Follow these instructions as told to lessen the pain that you are feeling:  Take medicines only as told by your doctor.  Rest the sore joint for as long as told by your doctor. If your doctor tells you to, raise (elevate) the painful joint above the level of your heart while you are sitting or lying down.  Do not do things that cause pain or make the pain worse.  If told, put ice on the painful area: ? Put ice in a plastic bag. ? Place a towel between your skin and the bag. ? Leave the ice on for 20 minutes, 2-3 times per day.  Wear an elastic bandage, splint, or sling as told by your doctor. Loosen the bandage or splint if your fingers or toes lose feeling (become numb) and tingle, or if they turn cold and blue.  Begin exercising or stretching the joint as told by your doctor. Ask your doctor what types of exercise are safe for you.  Keep all follow-up visits as told by your doctor. This is important.  Contact a doctor if:  Your pain gets worse and medicine does not help  it.  Your joint pain does not get better in 3 days.  You have more bruising or swelling.  You have a fever.  You lose 10 pounds (4.5 kg) or more without trying. Get help right away if:  You are not able to move the joint.  Your fingers or toes become numb or they turn cold and blue. This  information is not intended to replace advice given to you by your health care provider. Make sure you discuss any questions you have with your health care provider. Document Released: 04/19/2009 Document Revised: 10/07/2015 Document Reviewed: 02/10/2014 Elsevier Interactive Patient Education  2018 [place].  Rheumatoid Arthritis Rheumatoid arthritis (RA) is a long-term (chronic) disease. RA causes inflammation in your joints. Your joints may feel painful, stiff, swollen, warm, or tender. RA may start slowly. Usually, it affects the small joints of the hands and feet. It can also affect other parts of the body, even the heart, eyes, or lungs. Symptoms of RA often come and go. Sometimes, symptoms get worse for a while. These are called flares. There is no cure for RA, but your doctor will work with you to find the best treatment option for you. This will depend on how the disease is changing in your body. Follow these instructions at home:  Take over-the-counter and prescription medicines only as told by your doctor. Your doctor may change (adjust) your medicines every 3 months.  Start an exercise program as told by your doctor.  Rest when you have a flare.  Return to your normal activities as told by your doctor. Ask your doctor what activities are safe for you.  Keep all follow-up visits as told by your doctor. This is important. Contact a doctor if:  You have a flare.  You have a fever.  You have problems (side effects) because of your medicines. Get help right away if:  You have chest pain.  You have trouble breathing.  You have a hot, painful joint all of a sudden, and it is worse than your usual joint aches. This information is not intended to replace advice given to you by your health care provider. Make sure you discuss any questions you have with your health care provider. Document Released: 07/24/2011 Document Revised: 10/07/2015 Document Reviewed: 02/11/2015 Elsevier  Interactive Patient Education  [employer].

## 2017-03-26 NOTE — Progress Notes (Signed)
Assessment & Plan:  Seth Bell was seen today for follow-up.  Diagnoses and all orders for this visit:  Pain of both wrist joints -     Rheumatoid factor -     RA Qn+CCP(IgG/A)+SjoSSA+SjoSSB -     ANA -     Uric Acid -     predniSONE (DELTASONE) 20 MG tablet; Take 1 tablet (20 mg total) daily with breakfast for 7 days by mouth.  Other iron deficiency anemia -     Ambulatory referral to Gastroenterology NEEDS COLONOSCOPY AS WELL      Subjective:   Chief Complaint  Patient presents with  . Follow-up    Patient stated that he is having swelling on his wrist, knee, and ankles.    HPI Seth Bell 61 y.o. male presents to office today for follow up and to establish care as a new patient.  He is accompanied in the office today by his daughter.   Anemia Patient presents for follow up of anemia. Anemia was found by ER visit.  It has been present for 2 months.  Associated signs & symptoms: fatigue. Referral to GI has been made as patient is also overdue for colonoscopy.   Joint/Muscle Pain He has a history of complaints of B/L ankle, wrist and knee pain. He has been referred to rheumatology.  Patient complains of arthralgias for which has been present for several months. Pain is located in multiple joints, is described as aching, sharp and throbbing, and is constant, moderate .  Associated symptoms include: decreased range of motion and edema.  The patient has tried NSAIDs, position change, rest and tramadol for pain, with no reliefRelated to injury: no. Endorses pain being worse in the morning.  Sed rate and CRP on 03/12/2017 were elevated. Will order additional studies today.   Past Medical History:  Diagnosis Date  . Arthritis    "left hip" (10/27/2014)  . Joint pain     Past Surgical History:  Procedure Laterality Date  . JOINT REPLACEMENT    . TOTAL HIP ARTHROPLASTY Left 10/27/2014    Family History  Problem Relation Age of Onset  . Cirrhosis Mother   . HIV/AIDS Father       Social History   Socioeconomic History  . Marital status: Divorced    Spouse name: Not on file  . Number of children: Not on file  . Years of education: Not on file  . Highest education level: Not on file  Social Needs  . Financial resource strain: Not on file  . Food insecurity - worry: Not on file  . Food insecurity - inability: Not on file  . Transportation needs - medical: Not on file  . Transportation needs - non-medical: Not on file  Occupational History  . Not on file  Tobacco Use  . Smoking status: Current Every Day Smoker    Packs/day: 1.50    Years: 45.00    Pack years: 67.50    Types: Cigarettes  . Smokeless tobacco: Never Used  Substance and Sexual Activity  . Alcohol use: Yes    Alcohol/week: 42.0 oz    Types: 70 Cans of beer per week    Comment: drink 12 pack per day  . Drug use: No  . Sexual activity: Yes  Other Topics Concern  . Not on file  Social History Narrative  . Not on file    Outpatient Medications Prior to Visit  Medication Sig Dispense Refill  . amoxicillin (AMOXIL) 500 MG capsule Take  1 capsule (500 mg total) by mouth 3 (three) times daily. 21 capsule 0  . aspirin 325 MG tablet Take 1 tablet (325 mg total) by mouth daily. 30 tablet 0  . docusate sodium (COLACE) 100 MG capsule Take 1 capsule (100 mg total) by mouth 2 (two) times daily. 10 capsule 0  . ferrous sulfate 325 (65 FE) MG tablet Take 1 tablet (325 mg total) by mouth daily with breakfast. 100 tablet 3  . methocarbamol (ROBAXIN) 500 MG tablet Take 1 tablet (500 mg total) by mouth 4 (four) times daily. 60 tablet 0  . ondansetron (ZOFRAN) 4 MG tablet Take 1 tablet (4 mg total) by mouth every 8 (eight) hours as needed for nausea or vomiting. 20 tablet 0  . traMADol (ULTRAM) 50 MG tablet Take 1 tablet (50 mg total) by mouth every 6 (six) hours as needed. 30 tablet 0  . meloxicam (MOBIC) 15 MG tablet Take 1 tablet (15 mg total) by mouth daily. 15 tablet 0  . potassium chloride SA  (K-DUR,KLOR-CON) 20 MEQ tablet Take 1 tablet (20 mEq total) by mouth daily. 4 tablet 0   No facility-administered medications prior to visit.     No Known Allergies  Review of Systems  Constitutional: Positive for malaise/fatigue and weight loss. Negative for chills and fever.  Respiratory: Negative.  Negative for cough, sputum production and shortness of breath.   Cardiovascular: Negative.  Negative for chest pain and leg swelling.  Gastrointestinal: Negative for abdominal pain, constipation and diarrhea.  Musculoskeletal: Positive for joint pain and myalgias.  Skin: Negative.   Neurological: Positive for weakness. Negative for dizziness, tingling and headaches.  Psychiatric/Behavioral: Negative.        Objective:    Physical Exam  Constitutional: He is oriented to person, place, and time. He appears well-developed and well-nourished. He is cooperative.  HENT:  Head: Normocephalic and atraumatic.  Eyes: EOM are normal.  Neck: Normal range of motion.  Cardiovascular: Normal rate, regular rhythm, normal heart sounds and intact distal pulses. Exam reveals no gallop and no friction rub.  No murmur heard. Pulmonary/Chest: Effort normal and breath sounds normal. No tachypnea. No respiratory distress. He has no decreased breath sounds. He has no wheezes. He has no rhonchi. He has no rales. He exhibits no tenderness.  Abdominal: Soft. Bowel sounds are normal.  Musculoskeletal: He exhibits tenderness. He exhibits no edema.       Right knee: He exhibits decreased range of motion and swelling. Tenderness found.       Left knee: He exhibits decreased range of motion and swelling. Tenderness found.       Right ankle: Tenderness.       Left ankle: Tenderness.       Right hand: He exhibits decreased range of motion, tenderness and swelling.       Left hand: He exhibits decreased range of motion, tenderness and swelling.  Neurological: He is alert and oriented to person, place, and time. No  sensory deficit. Coordination and gait normal.  Skin: Skin is warm and dry.  Psychiatric: He has a normal mood and affect. His behavior is normal. Judgment and thought content normal.  Nursing note and vitals reviewed.   BP 107/73 (BP Location: Left Arm, Patient Position: Sitting, Cuff Size: Normal)   Pulse 99   Temp 97.7 F (36.5 C) (Oral)   Resp 18   Ht 5\' 11"  (1.803 m)   Wt 119 lb 9.6 oz (54.3 kg)   SpO2 97%  BMI 16.68 kg/m  Wt Readings from Last 3 Encounters:  03/26/17 119 lb 9.6 oz (54.3 kg)  03/12/17 123 lb (55.8 kg)  03/04/17 116 lb (52.6 kg)      Patient has been counseled extensively about nutrition and exercise as well as the importance of adherence with medications and regular follow-up. The patient was given clear instructions to go to ER or return to medical center if symptoms don't improve, worsen or new problems develop. The patient verbalized understanding.   Follow-up: Return in about 4 weeks (around 04/23/2017) for Follow up.   Gildardo Pounds, FNP-BC [facility] and [place], [place]   03/26/2017, 12:33 PM

## 2017-03-27 ENCOUNTER — Telehealth: Payer: Self-pay | Admitting: Nurse Practitioner

## 2017-03-27 ENCOUNTER — Telehealth: Payer: Self-pay

## 2017-03-27 ENCOUNTER — Telehealth (INDEPENDENT_AMBULATORY_CARE_PROVIDER_SITE_OTHER): Payer: Self-pay | Admitting: *Deleted

## 2017-03-27 NOTE — Telephone Encounter (Signed)
-----   Message from Gildardo Pounds, NP sent at 03/27/2017  8:37 AM EST ----- Uric acid does not show gout. Still awaiting additional tests

## 2017-03-27 NOTE — Telephone Encounter (Signed)
MA unable to reach patient or leave a voice message. !!!Please inform patient of potassium level improving but sodium is still low. Patient has been referred to a rheumatologist and an iron prescription sent to the pharmacy to take daily. Patient should avoid alcohol and drink plenty of water!!!

## 2017-03-27 NOTE — Telephone Encounter (Signed)
-----   Message from Argentina Donovan, [place] sent at 03/13/2017  9:15 AM EDT ----- Please call patient.  His potassium has improved.  His sodium is still low.  His inflammatory rate is high and I have referred him to a specialist.  His Iron is low and I have sent him a prescription of iron to take daily.  He should avoid drinking alcohol as this will help his Iron and electrolytes.  Eat healthy foods and drink adequate water. Follow-up as planned. Thanks, Freeman Caldron, PA-C

## 2017-03-27 NOTE — Telephone Encounter (Signed)
Patient's daughter is aware of the lab result.

## 2017-03-27 NOTE — Telephone Encounter (Signed)
Pt. Daughter returned call regarding results. Please f/u

## 2017-03-28 LAB — RA QN+CCP(IGG/A)+SJOSSA+SJOSSB
Cyclic Citrullin Peptide Ab: 13 units (ref 0–19)
ENA SSB (LA) Ab: <0.2 AI (ref 0.0–0.9)

## 2017-03-28 LAB — ANA: ANA: NEGATIVE

## 2017-03-28 LAB — URIC ACID: Uric Acid: 1.5 mg/dL — ABNORMAL LOW (ref 3.7–8.6)

## 2017-03-29 ENCOUNTER — Telehealth: Payer: Self-pay

## 2017-03-29 NOTE — Telephone Encounter (Signed)
-----   Message from Gildardo Pounds, NP sent at 03/28/2017 11:38 PM EST ----- Labs are negative for any Rheumatoid arthritis at this time.

## 2017-03-29 NOTE — Telephone Encounter (Signed)
Patient daughter answered and is informed of patient's lab result.  Patient daughter verified herself.

## 2017-04-23 ENCOUNTER — Ambulatory Visit: Payer: Self-pay | Admitting: Nurse Practitioner

## 2017-04-25 ENCOUNTER — Ambulatory Visit: Payer: Self-pay | Admitting: Nurse Practitioner

## 2017-04-27 ENCOUNTER — Encounter: Payer: Self-pay | Admitting: Nurse Practitioner

## 2017-04-27 ENCOUNTER — Ambulatory Visit: Payer: Self-pay | Attending: Nurse Practitioner | Admitting: Nurse Practitioner

## 2017-04-27 VITALS — BP 108/71 | HR 107 | Temp 97.7°F | Ht 71.0 in | Wt 118.0 lb

## 2017-04-27 DIAGNOSIS — Z79899 Other long term (current) drug therapy: Secondary | ICD-10-CM | POA: Insufficient documentation

## 2017-04-27 DIAGNOSIS — Z96642 Presence of left artificial hip joint: Secondary | ICD-10-CM | POA: Insufficient documentation

## 2017-04-27 DIAGNOSIS — Z830 Family history of human immunodeficiency virus [HIV] disease: Secondary | ICD-10-CM | POA: Insufficient documentation

## 2017-04-27 DIAGNOSIS — M159 Polyosteoarthritis, unspecified: Secondary | ICD-10-CM

## 2017-04-27 DIAGNOSIS — Z8379 Family history of other diseases of the digestive system: Secondary | ICD-10-CM | POA: Insufficient documentation

## 2017-04-27 DIAGNOSIS — K5900 Constipation, unspecified: Secondary | ICD-10-CM

## 2017-04-27 DIAGNOSIS — Z7982 Long term (current) use of aspirin: Secondary | ICD-10-CM | POA: Insufficient documentation

## 2017-04-27 DIAGNOSIS — E871 Hypo-osmolality and hyponatremia: Secondary | ICD-10-CM

## 2017-04-27 DIAGNOSIS — F17200 Nicotine dependence, unspecified, uncomplicated: Secondary | ICD-10-CM

## 2017-04-27 DIAGNOSIS — K5909 Other constipation: Secondary | ICD-10-CM | POA: Insufficient documentation

## 2017-04-27 DIAGNOSIS — F17210 Nicotine dependence, cigarettes, uncomplicated: Secondary | ICD-10-CM | POA: Insufficient documentation

## 2017-04-27 MED ORDER — DOCUSATE SODIUM 100 MG PO CAPS: 100.0000 mg | ORAL_CAPSULE | Freq: Two times a day (BID) | ORAL | 3 refills | Status: AC

## 2017-04-27 MED ORDER — DICLOFENAC SODIUM 1 % TD GEL: 2.0000 g | Freq: Four times a day (QID) | TRANSDERMAL | 1 refills | Status: AC

## 2017-04-27 MED FILL — DICLOFENAC SODIUM 1% GEL: 1 | 12 days supply | Qty: 100 | Fill #0

## 2017-04-27 NOTE — Progress Notes (Signed)
Assessment & Plan:  Seth Bell was seen today for follow-up.  Diagnoses and all orders for this visit:  Osteoarthritis of multiple joints, unspecified osteoarthritis type -     Ambulatory referral to Orthopedic Surgery -     diclofenac sodium (VOLTAREN) 1 % GEL; Apply 2 g topically 4 (four) times daily.  Hyponatremia -     Comprehensive metabolic panel PATIENT REFUSED LABS TODAY  Chronic Constipation    docusate sodium (COLACE) 100 MG capsule; Take 1 capsule (100 mg total) by mouth 2 (two) times daily.  Tobacco Dependence [place] was counseled on the dangers of tobacco use, and was advised to quit. Reviewed strategies to maximize success, including removing cigarettes and smoking materials from environment, stress management and support of family/friends as well as pharmacological alternatives including: Wellbutrin, Chantix, Nicotine patch, Nicotine gum or lozenges. Smoking cessation support: smoking cessation hotline: 1-800-QUIT-NOW.  Smoking cessation classes are also available through [facility] and [facility]. Call [phone] or visit our website at [email or web address]   Spent 3 minutes counseling on smoking cessation and patient is not ready to quit.  Patient has been counseled on age-appropriate routine health concerns for screening and prevention. These are reviewed and up-to-date. Referrals have been placed accordingly. Immunizations are up-to-date or declined.    Subjective:   Chief Complaint  Patient presents with  . Follow-up    Patient is here for a follow-up. Patient stated that it have not improve from the last visit.    HPI Seth Bell 61 y.o. male presents to office today for follow up to generalized OA. He is accompanied by his daughter. His recent labs were negative for RA. He reports continued arthritic pain in his bilateral hands and knees with slight relief of pain from a trial of prednisone prescribed after an office visit with me a few weeks ago.    Review of Systems  Constitutional: Negative for fever, malaise/fatigue and weight loss.  HENT: Negative.  Negative for nosebleeds.   Eyes: Negative.  Negative for blurred vision, double vision and photophobia.  Respiratory: Negative.  Negative for cough and shortness of breath.   Cardiovascular: Negative.  Negative for chest pain, palpitations and leg swelling.  Gastrointestinal: Negative.  Negative for abdominal pain, constipation, diarrhea, heartburn, nausea and vomiting.  Musculoskeletal: Positive for back pain and joint pain. Negative for myalgias.  Neurological: Negative.  Negative for dizziness, focal weakness, seizures and headaches.  Endo/Heme/Allergies: Negative for environmental allergies.  Psychiatric/Behavioral: Negative.  Negative for suicidal ideas.    Past Medical History:  Diagnosis Date  . Arthritis    "left hip" (10/27/2014)  . Joint pain     Past Surgical History:  Procedure Laterality Date  . JOINT REPLACEMENT    . TOTAL HIP ARTHROPLASTY Left 10/27/2014  . TOTAL HIP ARTHROPLASTY Left 10/27/2014   Procedure: TOTAL HIP ARTHROPLASTY ANTERIOR APPROACH;  Surgeon: Renette Butters, MD;  Location: [place];  Service: Orthopedics;  Laterality: Left;    Family History  Problem Relation Age of Onset  . Cirrhosis Mother   . HIV/AIDS Father     Social History Reviewed with no changes to be made today.   Outpatient Medications Prior to Visit  Medication Sig Dispense Refill  . aspirin 325 MG tablet Take 1 tablet (325 mg total) by mouth daily. (Patient not taking: Reported on 04/27/2017) 30 tablet 0  . potassium chloride SA (K-DUR,KLOR-CON) 20 MEQ tablet Take 1 tablet (20 mEq total) by mouth daily. 4 tablet 0  . amoxicillin (AMOXIL)  500 MG capsule Take 1 capsule (500 mg total) by mouth 3 (three) times daily. (Patient not taking: Reported on 04/27/2017) 21 capsule 0  . docusate sodium (COLACE) 100 MG capsule Take 1 capsule (100 mg total) by mouth 2 (two) times daily.  (Patient not taking: Reported on 04/27/2017) 10 capsule 0  . ferrous sulfate 325 (65 FE) MG tablet Take 1 tablet (325 mg total) by mouth daily with breakfast. (Patient not taking: Reported on 04/27/2017) 100 tablet 3  . methocarbamol (ROBAXIN) 500 MG tablet Take 1 tablet (500 mg total) by mouth 4 (four) times daily. (Patient not taking: Reported on 04/27/2017) 60 tablet 0  . ondansetron (ZOFRAN) 4 MG tablet Take 1 tablet (4 mg total) by mouth every 8 (eight) hours as needed for nausea or vomiting. (Patient not taking: Reported on 04/27/2017) 20 tablet 0  . traMADol (ULTRAM) 50 MG tablet Take 1 tablet (50 mg total) by mouth every 6 (six) hours as needed. (Patient not taking: Reported on 04/27/2017) 30 tablet 0   No facility-administered medications prior to visit.     No Known Allergies     Objective:    BP 108/71 (BP Location: Right Arm, Patient Position: Sitting, Cuff Size: Normal)   Pulse (!) 107   Temp 97.7 F (36.5 C) (Oral)   Ht 5\' 11"  (1.803 m)   Wt 118 lb (53.5 kg)   SpO2 100%   BMI 16.46 kg/m  Wt Readings from Last 3 Encounters:  04/27/17 118 lb (53.5 kg)  03/26/17 119 lb 9.6 oz (54.3 kg)  03/12/17 123 lb (55.8 kg)    Physical Exam  Constitutional: He is oriented to person, place, and time. He appears well-developed and well-nourished. He is cooperative.  HENT:  Head: Normocephalic and atraumatic.  Eyes: EOM are normal.  Neck: Normal range of motion.  Cardiovascular: Normal rate, regular rhythm, normal heart sounds and intact distal pulses. Exam reveals no gallop and no friction rub.  No murmur heard. Pulmonary/Chest: Effort normal and breath sounds normal. No tachypnea. No respiratory distress. He has no decreased breath sounds. He has no wheezes. He has no rhonchi. He has no rales. He exhibits no tenderness.  Abdominal: Soft. Bowel sounds are normal.  Musculoskeletal: Normal range of motion. He exhibits no edema.       Right knee: He exhibits swelling.       Left  knee: He exhibits swelling.       Right hand: He exhibits swelling.       Left hand: He exhibits swelling.  Neurological: He is alert and oriented to person, place, and time. Coordination normal.  Skin: Skin is warm and dry.  Psychiatric: He has a normal mood and affect. His behavior is normal. Judgment and thought content normal.  Nursing note and vitals reviewed.     Patient has been counseled extensively about nutrition and exercise as well as the importance of adherence with medications and regular follow-up. The patient was given clear instructions to go to ER or return to medical center if symptoms don't improve, worsen or new problems develop. The patient verbalized understanding.   Follow-up: Return in about 6 weeks (around 06/08/2017), or if symptoms worsen or fail to improve, for f/u for arthritis needs 30 minutes .   Gildardo Pounds, FNP-BC [facility] and [place], [place]   04/27/2017, 1:49 PM

## 2017-05-01 ENCOUNTER — Encounter: Payer: Self-pay | Admitting: Nurse Practitioner

## 2017-05-01 DIAGNOSIS — F17200 Nicotine dependence, unspecified, uncomplicated: Secondary | ICD-10-CM | POA: Insufficient documentation

## 2017-06-05 ENCOUNTER — Emergency Department (HOSPITAL_COMMUNITY): Payer: Self-pay

## 2017-06-05 ENCOUNTER — Emergency Department (HOSPITAL_COMMUNITY)
Admission: EM | Admit: 2017-06-05 | Discharge: 2017-06-05 | Disposition: A | Discharge status: Home / Self Care | Payer: Self-pay | Attending: Emergency Medicine | Admitting: Emergency Medicine

## 2017-06-05 ENCOUNTER — Encounter (HOSPITAL_COMMUNITY): Payer: Self-pay | Admitting: *Deleted

## 2017-06-05 ENCOUNTER — Encounter (HOSPITAL_COMMUNITY): Payer: Self-pay

## 2017-06-05 ENCOUNTER — Emergency Department (HOSPITAL_COMMUNITY)
Admission: EM | Admit: 2017-06-05 | Discharge: 2017-06-15 | Disposition: E | Payer: Self-pay | Attending: Emergency Medicine | Admitting: Emergency Medicine

## 2017-06-05 DIAGNOSIS — F17210 Nicotine dependence, cigarettes, uncomplicated: Secondary | ICD-10-CM | POA: Insufficient documentation

## 2017-06-05 DIAGNOSIS — C3492 Malignant neoplasm of unspecified part of left bronchus or lung: Secondary | ICD-10-CM | POA: Insufficient documentation

## 2017-06-05 DIAGNOSIS — Z96642 Presence of left artificial hip joint: Secondary | ICD-10-CM | POA: Insufficient documentation

## 2017-06-05 DIAGNOSIS — Z79899 Other long term (current) drug therapy: Secondary | ICD-10-CM | POA: Insufficient documentation

## 2017-06-05 DIAGNOSIS — Z7982 Long term (current) use of aspirin: Secondary | ICD-10-CM | POA: Insufficient documentation

## 2017-06-05 DIAGNOSIS — I469 Cardiac arrest, cause unspecified: Secondary | ICD-10-CM | POA: Insufficient documentation

## 2017-06-05 LAB — COMPREHENSIVE METABOLIC PANEL
ALK PHOS: 68 U/L (ref 38–126)
ALT: 7 U/L — ABNORMAL LOW (ref 17–63)
ANION GAP: 11 (ref 5–15)
AST: 20 U/L (ref 15–41)
Albumin: 2.5 g/dL — ABNORMAL LOW (ref 3.5–5.0)
BUN: 15 mg/dL (ref 6–20)
CALCIUM: 12.9 mg/dL — AB (ref 8.9–10.3)
CO2: 25 mmol/L (ref 22–32)
Chloride: 101 mmol/L (ref 101–111)
Creatinine, Ser: 1.1 mg/dL (ref 0.61–1.24)
GFR calc non Af Amer: >60 mL/min (ref >60)
Glucose, Bld: 79 mg/dL (ref 65–99)
Potassium: 3 mmol/L — ABNORMAL LOW (ref 3.5–5.1)
SODIUM: 137 mmol/L (ref 135–145)
TOTAL PROTEIN: 7.5 g/dL (ref 6.5–8.1)
Total Bilirubin: 0.9 mg/dL (ref 0.3–1.2)

## 2017-06-05 LAB — CBC WITH DIFFERENTIAL/PLATELET
BASOS ABS: 0 10*3/uL (ref 0.0–0.1)
BASOS PCT: 0 %
Eosinophils Absolute: 0.1 10*3/uL (ref 0.0–0.7)
Eosinophils Relative: 0 %
HCT: 32.8 % — ABNORMAL LOW (ref 39.0–52.0)
HEMOGLOBIN: 10.6 g/dL — AB (ref 13.0–17.0)
Lymphocytes Relative: 12 %
Lymphs Abs: 2.4 10*3/uL (ref 0.7–4.0)
MCH: 28.7 pg (ref 26.0–34.0)
MCHC: 32.3 g/dL (ref 30.0–36.0)
MCV: 88.9 fL (ref 78.0–100.0)
MONOS PCT: 5 %
Monocytes Absolute: 1 10*3/uL (ref 0.1–1.0)
NEUTROS ABS: 16.4 10*3/uL — AB (ref 1.7–7.7)
Neutrophils Relative %: 83 %
Platelets: 519 10*3/uL — ABNORMAL HIGH (ref 150–400)
RBC: 3.69 MIL/uL — ABNORMAL LOW (ref 4.22–5.81)
RDW: 16.4 % — ABNORMAL HIGH (ref 11.5–15.5)
WBC: 19.9 10*3/uL — AB (ref 4.0–10.5)

## 2017-06-05 MED ORDER — POTASSIUM CHLORIDE CRYS ER 20 MEQ PO TBCR: 20.0000 meq | EXTENDED_RELEASE_TABLET | Freq: Two times a day (BID) | ORAL | 0 refills | Status: AC

## 2017-06-05 MED ORDER — VANCOMYCIN HCL 10 G IV SOLR
1250.0000 mg | Freq: Once | INTRAVENOUS | Status: DC
  Filled 2017-06-05: qty 1250

## 2017-06-05 MED ORDER — DEXTROSE 5 % IV SOLN: 1.0000 g | Freq: Once | INTRAVENOUS | Status: DC

## 2017-06-05 MED ORDER — DEXTROSE 5 % IV SOLN: 500.0000 mg | Freq: Once | INTRAVENOUS | Status: DC

## 2017-06-05 MED ORDER — SODIUM CHLORIDE 0.9 % IV BOLUS (SEPSIS)
500.0000 mL | Freq: Once | INTRAVENOUS | Status: AC
  Administered 2017-06-05: 500 mL via INTRAVENOUS

## 2017-06-05 MED ORDER — IOPAMIDOL (ISOVUE-300) INJECTION 61%
INTRAVENOUS | Status: AC
  Administered 2017-06-05: 75 mL via INTRAVENOUS
  Filled 2017-06-05: qty 75

## 2017-06-05 MED ORDER — POTASSIUM CHLORIDE CRYS ER 20 MEQ PO TBCR
40.0000 meq | EXTENDED_RELEASE_TABLET | Freq: Once | ORAL | Status: AC
  Administered 2017-06-05: 40 meq via ORAL
  Filled 2017-06-05: qty 2

## 2017-06-05 MED ORDER — ACETAMINOPHEN 325 MG PO TABS
650.0000 mg | ORAL_TABLET | Freq: Once | ORAL | Status: AC
  Administered 2017-06-05: 650 mg via ORAL
  Filled 2017-06-05: qty 2

## 2017-06-05 NOTE — ED Notes (Signed)
PT STATES HE DOES HAVE A SIGN ON HIS HOUSE TO TREAT HIS HOME FOR BED BUGS.  HE STATES HE HAS NOT SEEN ANY BED BUGS. HE RENTS HIS HOME. AT PRESENT IT DOES NOT APPEAR PT HAS ANY LIVE BED BUGS

## 2017-06-05 NOTE — ED Notes (Signed)
Bed: YT01 Expected date:  Expected time:  Means of arrival:  Comments: 6

## 2017-06-05 NOTE — ED Notes (Signed)
ED Provider at bedside. EDP [place]. NEGATIVE FOR TB. PT IS AWARE HE HAS CANCER

## 2017-06-05 NOTE — ED Provider Notes (Signed)
Blood pressure 125/77, pulse 99, temperature 98.2 F (36.8 C), temperature source Oral, resp. rate (!) 23, height 5\' 11"  (1.803 m), weight 54.4 kg (120 lb), SpO2 100 %.  Assuming care from Dr. Regenia Skeeter.  In short, Seth Bell is a 62 y.o. male with a chief complaint of Arm Pain; Leg Pain; and possible bed bugs .  Refer to the original H&P for additional details.  The current plan of care is to follow head CT and reassess.  04:54 PM Spoke with patient regarding his normal CT scan.  Will start potassium supplementation.  Ambulatory referral placed for medical oncology.  I discussed that they would be calling him for an appointment but if he is not heard in the next 1-2 days he should call them with the number provided on discharge paperwork.  He did provide his cell phone number.  And for discharge at this time. Again discussed the concern for cancer diagnosis and need for prompt outpatient follow up.   Nanda Quinton, MD   Margette Fast, MD 06/10/2017 [phone]

## 2017-06-05 NOTE — ED Notes (Signed)
Bed: VD47 Expected date: 06/09/2017 Expected time:  Means of arrival:  Comments: 61 leg pain bed bugs

## 2017-06-05 NOTE — Progress Notes (Signed)
A consult was received from an ED physician for vancomycin per pharmacy dosing.  The patient's profile has been reviewed for ht/wt/allergies/indication/available labs.   A one time order has been placed for vancomycin 1250 mg .  Further antibiotics/pharmacy consults should be ordered by admitting physician if indicated.                       Thank you, Eudelia Bunch, Pharm.D. [phone] 05/15/2017 3:33 PM

## 2017-06-05 NOTE — ED Notes (Signed)
[place] bus stop unresponsive with a weak pulse, security was told to call 911, CPR was started and pt brought to the ED

## 2017-06-05 NOTE — ED Notes (Signed)
PT HAS NO WAY HOME. PT STATES HE CAN RIDE BUS HOWEVER HAS ONLY PJs. WILL OBTAIN CLOTHING FROM [facility] OFFICE AND DISCHARGE PT. CHARGE MATT RN AWARE

## 2017-06-05 NOTE — ED Triage Notes (Signed)
Per EMS, pt from home complains of left arm pain, bilateral leg pain for the past month. Pt has large knot on the back of his left upper arm.   EMS states they saw bed bugs inside the pt's house, and there was a sign stating the pt has bed bugs.   BP 127/90 HR 100 SpO2 100% RR 20 CBG 82

## 2017-06-05 NOTE — Discharge Instructions (Signed)
You were seen in the ED today with pain and found to have a mass in the lungs. This will need to be investigated further but I am concerned that this could be cancer. I have asked the cancer doctors to call you this week to set up an appointment with them. If you do not hear from them in the next 1-2 days you should call them. Return to the ED with any new or worsening symptoms including chest pain, weakness, difficulty breathing, or fever.

## 2017-06-05 NOTE — ED Provider Notes (Signed)
[facility] DEPT Provider Note   CSN: [id number] Arrival date & time: 05/31/2017  1129     History   Chief Complaint Chief Complaint  Patient presents with  . Arm Pain  . Leg Pain  . possible bed bugs    HPI [facility] Bell is a 62 y.o. male.  HPI  62 year old male with a history of chronic arthritis presents with extremity pain.  The patient is an overall poor historian.  EMS was called and picked him up and noticed bedbugs at his living place as well as a sign indicating that the house had bedbugs.  The patient is complaining of chronic bilateral arm and leg pain.  He tells me these have been ongoing for months.  He appears to have a left wrist drop and he states this is been for 2 months or more.  He has had chronic bilateral hand swelling and bilateral leg pain.  He states all of these are months on end.  Has not taken anything for the pain for a couple months when asked what he uses for pain control.  He does indicate a cough for the last 4 days, nonproductive.  No fevers, shortness of breath, chest pain, abdominal pain.  There is left upper arm swelling that the patient states started yesterday.  He states there is no tenderness.  Past Medical History:  Diagnosis Date  . Arthritis    "left hip" (10/27/2014)  . Joint pain     Patient Active Problem List   Diagnosis Date Noted  . Tobacco dependence 05/01/2017  . Primary osteoarthritis of left hip 10/28/2014  . DJD (degenerative joint disease) 10/27/2014    Past Surgical History:  Procedure Laterality Date  . JOINT REPLACEMENT    . TOTAL HIP ARTHROPLASTY Left 10/27/2014  . TOTAL HIP ARTHROPLASTY Left 10/27/2014   Procedure: TOTAL HIP ARTHROPLASTY ANTERIOR APPROACH;  Surgeon: Renette Butters, MD;  Location: [facility];  Service: Orthopedics;  Laterality: Left;       Home Medications    Prior to Admission medications   Medication Sig Start Date End Date Taking? Authorizing Provider  aspirin 325  MG tablet Take 1 tablet (325 mg total) by mouth daily. Patient not taking: Reported on 04/27/2017 10/27/14   Lovett Calender, PA-C  diclofenac sodium (VOLTAREN) 1 % GEL Apply 2 g topically 4 (four) times daily. Patient not taking: Reported on 05/25/2017 04/27/17   Gildardo Pounds, NP  potassium chloride SA (K-DUR,KLOR-CON) 20 MEQ tablet Take 1 tablet (20 mEq total) by mouth daily. 03/04/17 03/08/17  LongWonda Olds, MD    Family History Family History  Problem Relation Age of Onset  . Cirrhosis Mother   . HIV/AIDS Father     Social History Social History   Tobacco Use  . Smoking status: Current Every Day Smoker    Packs/day: 1.50    Years: 45.00    Pack years: 67.50    Types: Cigarettes  . Smokeless tobacco: Never Used  Substance Use Topics  . Alcohol use: Yes    Alcohol/week: 42.0 oz    Types: 70 Cans of beer per week    Comment: drink 12 pack per day  . Drug use: No     Allergies   Patient has no known allergies.   Review of Systems Review of Systems  Constitutional: Negative for fever.  Respiratory: Positive for cough. Negative for shortness of breath.   Cardiovascular: Negative for chest pain.  Gastrointestinal: Negative for abdominal pain.  Musculoskeletal: Positive for arthralgias and joint swelling.  All other systems reviewed and are negative.    Physical Exam Updated Vital Signs BP 125/77 (BP Location: Left Arm)   Pulse 99   Temp 98.2 F (36.8 C) (Oral)   Resp (!) 23   Ht 5\' 11"  (1.803 m)   Wt 54.4 kg (120 lb)   SpO2 100%   BMI 16.74 kg/m   Physical Exam  Constitutional: He is oriented to person, place, and time. He appears well-developed.  Non-toxic appearance. He does not have a sickly appearance. No distress.  frail  HENT:  Head: Normocephalic and atraumatic.  Right Ear: External ear normal.  Left Ear: External ear normal.  Nose: Nose normal.  Mouth/Throat: Mucous membranes are dry.  Diffusely poor dentition  Eyes: Right eye exhibits no  discharge. Left eye exhibits no discharge.  Neck: Neck supple.  Cardiovascular: Normal rate, regular rhythm and normal heart sounds.  Pulmonary/Chest: Effort normal and breath sounds normal. No accessory muscle usage. No tachypnea. He has no wheezes.  Abdominal: Soft. He exhibits no distension. There is no tenderness.  Musculoskeletal:       Left wrist: He exhibits decreased range of motion and swelling.       Left upper arm: He exhibits swelling.       Arms:      Right hand: He exhibits swelling.       Left hand: He exhibits swelling.  Left wrist drop Bilateral hand swelling, symmetric. Otherwise no joint swelling or decreased ROM besides left wrist, including shoulders, elbows, hips, knees and ankles  Neurological: He is alert and oriented to person, place, and time.  Skin: Skin is warm and dry. He is not diaphoretic.  Nursing note and vitals reviewed.    ED Treatments / Results  Labs (all labs ordered are listed, but only abnormal results are displayed) Labs Reviewed  COMPREHENSIVE METABOLIC PANEL - Abnormal; Notable for the following components:      Result Value   Potassium 3.0 (*)    Calcium 12.9 (*)    Albumin 2.5 (*)    ALT 7 (*)    All other components within normal limits  CBC WITH DIFFERENTIAL/PLATELET - Abnormal; Notable for the following components:   WBC 19.9 (*)    RBC 3.69 (*)    Hemoglobin 10.6 (*)    HCT 32.8 (*)    RDW 16.4 (*)    Platelets 519 (*)    Neutro Abs 16.4 (*)    All other components within normal limits  CULTURE, BLOOD (ROUTINE X 2)  CULTURE, BLOOD (ROUTINE X 2)  CULTURE, EXPECTORATED SPUTUM-ASSESSMENT  QUANTIFERON-TB GOLD PLUS    EKG  EKG Interpretation None       Radiology Dg Chest 2 View  Result Date: 05/24/2017 CLINICAL DATA:  Cough. EXAM: CHEST  2 VIEW COMPARISON:  None. FINDINGS: Dense opacification at the left apex with central cavitary features. Possible left hilar adenopathy on the lateral view. Normal heart size and  aortic contours. No edema, effusion, or pneumothorax. Remote right rib fractures. Nipple shadow on the left. IMPRESSION: Cavitary pneumonia or mass in the left upper lobe. Recommend chest CT with contrast. Electronically Signed   By: Monte Fantasia M.D.   On: 05/19/2017 13:57   Ct Chest W Contrast  Result Date: 05/19/2017 CLINICAL DATA:  Left arm pain and bilateral leg pain for the past month. Large not on back of left upper arm. EXAM: CT CHEST WITH CONTRAST TECHNIQUE: Multidetector CT imaging  of the chest was performed during intravenous contrast administration. CONTRAST:  63mL ISOVUE-300 IOPAMIDOL (ISOVUE-300) INJECTION 61% COMPARISON:  None. FINDINGS: Cardiovascular: Heart is normal size. Minimal calcified plaque over the left anterior descending coronary artery. Mild ectasia of the ascending thoracic aorta measuring 3.3 cm in AP diameter. Right pulmonary arteries are within normal. There is abrupt cutoff of the proximal left main pulmonary artery by the large mass in the left upper lobe likely direct invasion. Mediastinum/Nodes: 2.1 cm right paratracheal lymph node. The large left upper lobe mass extends to the hilum. No right hilar adenopathy. There is a 2.5 x 2.8 cm node over the posterior right neck base. Lungs/Pleura: Lungs are adequately inflated and demonstrate a large bilobed mass versus 2 adjacent masses occupying the left upper lobe with obstruction of the associated central bronchi. The more superior mass measures 7.1 x 7.1 cm and more inferior anterior mass measures 7.5 x 9.1 cm. Small amount of left pleural fluid. Subcentimeter sub solid nodule over the anterior aspect of the left lower lobe. 4 mm nodule over the posterior right upper lobe with adjacent spiculated 1.1 cm nodule over the posterior right upper lobe. Upper Abdomen: Soft tissue mass versus lymph node over the right inferior pericardial phrenic region measuring 2.1 x 2.6 cm. 2.6 cm mesenteric mass over the left upper quadrant. 3.7 cm  mass in the left paraspinal region between the to spine and left kidney. Musculoskeletal: 2.9 cm mass within the right superficial paraspinal muscles at the level of the diaphragm. 3.8 cm soft tissue mass versus abnormal lymph node in the left axillary region. There are a few small masses over the right intercostal regions. These masses and nodes are likely metastatic disease. Minimal degenerate change of the spine. Heterogeneous appearance of the sternum which may be due to metastatic disease. IMPRESSION: Single large bilobed mass versus 2 adjacent masses occupying most of the left upper lobe as the more superior apical mass measures 7.1 x 7.1 cm and more anterior inferior mass measures 7.5 x 9.1 cm extends to the hilum causing abrupt cut off/possible invasion of the left main pulmonary artery and obstruction of the proximal bronchi. 2.1 cm right paratracheal lymph node. Two adjacent right upper lobe nodules with the larger spiculated nodule measuring 1.1 cm. Multiple soft tissue masses over the thorax and abdomen as described compatible with metastatic disease. Mild heterogeneous sclerosis of the sternum suggesting metastatic disease. Findings likely due to primary bronchogenic carcinoma with metastatic disease. Recommend tissue sampling for further evaluation. Mild ectasia of the ascending thoracic aorta measuring 3.3 cm in AP diameter. Recommend annual imaging followup by CTA or MRA. This recommendation follows 2010 ACCF/AHA/AATS/ACR/ASA/SCA/SCAI/SIR/STS/SVM Guidelines for the Diagnosis and Management of Patients with Thoracic Aortic Disease. Circulation.2010; 121: W119-J478. Minimal atherosclerotic coronary artery disease. Electronically Signed   By: Marin Olp M.D.   On: 05/17/2017 15:28   Dg Humerus Left  Result Date: 05/22/2017 CLINICAL DATA:  Arm swelling EXAM: LEFT HUMERUS - 2+ VIEW COMPARISON:  None in PACs FINDINGS: The humerus is subjectively adequately mineralized. The observed portions of the  elbow and shoulder exhibit no acute abnormalities. There is soft tissue fullness both medially and laterally over the midportion of the arm which may reflect retracted muscle bellies involving the biceps and triceps. IMPRESSION: No acute bony abnormality of the left humerus. Probable retracted muscle bellies following tendinous rupture of the biceps and triceps. Electronically Signed   By: David  Martinique M.D.   On: 05/17/2017 13:56    Procedures Procedures (  including critical care time)  Medications Ordered in ED Medications  sodium chloride 0.9 % bolus 500 mL (500 mLs Intravenous New Bag/Given 06/13/2017 1302)  acetaminophen (TYLENOL) tablet 650 mg (650 mg Oral Given 06/02/2017 1301)  potassium chloride SA (K-DUR,KLOR-CON) CR tablet 40 mEq (40 mEq Oral Given 05/26/2017 1420)  iopamidol (ISOVUE-300) 61 % injection (75 mLs Intravenous Contrast Given 06/12/2017 1452)     Initial Impression / Assessment and Plan / ED Course  I have reviewed the triage vital signs and the nursing notes.  Pertinent labs & imaging results that were available during my care of the patient were reviewed by me and considered in my medical decision making (see chart for details).     Patient's presentation is consistent with chronic arthritis and extremity pain.  There appears to be no new pain.  However he has been having a new cough recently so a chest x-ray was obtained and shows mass versus pneumonia.  CT does not show pneumonia and instead shows very large mass with metastatic disease.  I discussed with oncology, Dr. Lebron Conners, who asks for head CT.  If the head CT does not show any mass or signs of metastasis, he can be discharged and follow-up urgently with their clinic.  Otherwise, call back for further recommendations.  Mild hypokalemia, replete in the ED.  Otherwise, he would be stable for discharge as he does not appear to have another acute emergent condition.  Care to Dr. Laverta Baltimore with CT head pending.  Final Clinical  Impressions(s) / ED Diagnoses   Final diagnoses:  Bronchogenic cancer of left lung [facility])    ED Discharge Orders    None       Sherwood Gambler, MD 06/01/2017 1622

## 2017-06-05 NOTE — ED Notes (Signed)
Patient transported to CT 

## 2017-06-06 LAB — CBG MONITORING, ED: GLUCOSE-CAPILLARY: 125 mg/dL — AB (ref 65–99)

## 2017-06-06 MED ORDER — SODIUM CHLORIDE 0.9 % IV SOLN
INTRAVENOUS | Status: AC | PRN
  Administered 2017-06-06: 1000 mL via INTRAVENOUS

## 2017-06-06 MED ORDER — POTASSIUM CHLORIDE 10 MEQ/100ML IV SOLN
INTRAVENOUS | Status: AC
  Filled 2017-06-06: qty 200

## 2017-06-06 MED ORDER — CALCIUM CHLORIDE 10 % IV SOLN
INTRAVENOUS | Status: AC | PRN
  Administered 2017-06-06 (×2): 1 g via INTRAVENOUS

## 2017-06-06 MED ORDER — LIDOCAINE HCL (CARDIAC) 20 MG/ML IV SOLN
INTRAVENOUS | Status: AC | PRN
  Administered 2017-06-06: 100 mg via INTRAVENOUS

## 2017-06-06 MED ORDER — SODIUM BICARBONATE 8.4 % IV SOLN
INTRAVENOUS | Status: AC | PRN
  Administered 2017-06-06 (×2): 50 meq via INTRAVENOUS

## 2017-06-06 MED ORDER — MAGNESIUM SULFATE 50 % IJ SOLN
INTRAMUSCULAR | Status: AC | PRN
  Administered 2017-06-06: 2 g via INTRAVENOUS

## 2017-06-06 MED ORDER — EPINEPHRINE PF 1 MG/10ML IJ SOSY
PREFILLED_SYRINGE | INTRAMUSCULAR | Status: AC | PRN
  Administered 2017-06-06 (×3): 1 mg via INTRAVENOUS

## 2017-06-06 MED FILL — Medication: Qty: 2 | Status: AC

## 2017-06-13 LAB — QUANTIFERON-TB GOLD PLUS (RQFGPL)
QUANTIFERON MITOGEN VALUE: 0.45 [IU]/mL
QUANTIFERON NIL VALUE: 0.05 [IU]/mL
QuantiFERON TB1 Ag Value: 0.05 IU/mL
QuantiFERON TB2 Ag Value: 0.04 IU/mL

## 2017-06-13 LAB — QUANTIFERON-TB GOLD PLUS: QUANTIFERON-TB GOLD PLUS: UNDETERMINED

## 2017-06-15 NOTE — ED Provider Notes (Signed)
[facility] DEPT Provider Note   CSN: [id number] Arrival date & time: 05/19/2017  2359     History   Chief Complaint Chief Complaint  Patient presents with  . CPR    HPI [facility] Bell is a 62 y.o. male.  HPI  Level 5 caveat for CPR.  62 year old man brought into the emergency room after being found unresponsive.  Patient has known history of arthritis, and polyarthralgia.  Patient was just discharged from the hospital earlier in the day.  Patient was at the bus stop, and found unresponsive by bystanders and EMS was called.  Patient arrives to the ER intubated, with CPR in progress.  Patient had received 5 rounds of epinephrine, 3 rounds of defibrillation's.  Patient had no ROSC.  CBG was normal.   Past Medical History:  Diagnosis Date  . Arthritis    "left hip" (10/27/2014)  . Joint pain     Patient Active Problem List   Diagnosis Date Noted  . Tobacco dependence 05/01/2017  . Primary osteoarthritis of left hip 10/28/2014  . DJD (degenerative joint disease) 10/27/2014    Past Surgical History:  Procedure Laterality Date  . JOINT REPLACEMENT    . TOTAL HIP ARTHROPLASTY Left 10/27/2014  . TOTAL HIP ARTHROPLASTY Left 10/27/2014   Procedure: TOTAL HIP ARTHROPLASTY ANTERIOR APPROACH;  Surgeon: Renette Butters, MD;  Location: [facility];  Service: Orthopedics;  Laterality: Left;       Home Medications    Prior to Admission medications   Medication Sig Start Date End Date Taking? Authorizing Provider  aspirin 325 MG tablet Take 1 tablet (325 mg total) by mouth daily. Patient not taking: Reported on 04/27/2017 10/27/14   Lovett Calender, PA-C  diclofenac sodium (VOLTAREN) 1 % GEL Apply 2 g topically 4 (four) times daily. Patient not taking: Reported on 05/16/2017 04/27/17   Gildardo Pounds, NP  potassium chloride SA (K-DUR,KLOR-CON) 20 MEQ tablet Take 1 tablet (20 mEq total) by mouth 2 (two) times daily for 4 days. 06/03/2017 06/09/17  Sherwood Gambler, MD    Family History Family History  Problem Relation Age of Onset  . Cirrhosis Mother   . HIV/AIDS Father     Social History Social History   Tobacco Use  . Smoking status: Current Every Day Smoker    Packs/day: 1.50    Years: 45.00    Pack years: 67.50    Types: Cigarettes  . Smokeless tobacco: Never Used  Substance Use Topics  . Alcohol use: Yes    Alcohol/week: 42.0 oz    Types: 70 Cans of beer per week    Comment: drink 12 pack per day  . Drug use: No     Allergies   Patient has no known allergies.   Review of Systems Review of Systems  Unable to perform ROS: Patient unresponsive     Physical Exam Updated Vital Signs There were no vitals taken for this visit.  Physical Exam  Constitutional:  Cachectic appearing, pale  HENT:  Head: Atraumatic.  Eyes:  Fixed and dilated  Neck: Neck supple.  Cardiovascular:  Pulseless  Pulmonary/Chest:  Intubated, no spontaneous respirations  Abdominal: Soft.  Neurological:  GCS of 3  Skin: Skin is warm.     ED Treatments / Results  Labs (all labs ordered are listed, but only abnormal results are displayed) Labs Reviewed  CBG MONITORING, ED - Abnormal; Notable for the following components:      Result Value   Glucose-Capillary 125 (*)  All other components within normal limits    EKG  EKG Interpretation None       Radiology Dg Chest 2 View  Result Date: 06/10/2017 CLINICAL DATA:  Cough. EXAM: CHEST  2 VIEW COMPARISON:  None. FINDINGS: Dense opacification at the left apex with central cavitary features. Possible left hilar adenopathy on the lateral view. Normal heart size and aortic contours. No edema, effusion, or pneumothorax. Remote right rib fractures. Nipple shadow on the left. IMPRESSION: Cavitary pneumonia or mass in the left upper lobe. Recommend chest CT with contrast. Electronically Signed   By: Monte Fantasia M.D.   On: 06/03/2017 13:57   Ct Head Wo Contrast  Result Date:  06/10/2017 CLINICAL DATA:  Left upper lung mass. EXAM: CT HEAD WITHOUT CONTRAST TECHNIQUE: Contiguous axial images were obtained from the base of the skull through the vertex without intravenous contrast. COMPARISON:  None. FINDINGS: Brain: Mild small vessel disease is present in the periventricular white matter. No evidence of acute infarction, hemorrhage, hydrocephalus, extra-axial collection or mass lesion/mass effect. Vascular: No hyperdense vessel or unexpected calcification. Skull: Normal. Negative for fracture or focal lesion. Sinuses/Orbits: No acute finding. Other: None. IMPRESSION: Unremarkable head CT.  No obvious metastatic disease identified. Electronically Signed   By: Aletta Edouard M.D.   On: 05/24/2017 16:46   Ct Chest W Contrast  Result Date: 06/01/2017 CLINICAL DATA:  Left arm pain and bilateral leg pain for the past month. Large not on back of left upper arm. EXAM: CT CHEST WITH CONTRAST TECHNIQUE: Multidetector CT imaging of the chest was performed during intravenous contrast administration. CONTRAST:  48mL ISOVUE-300 IOPAMIDOL (ISOVUE-300) INJECTION 61% COMPARISON:  None. FINDINGS: Cardiovascular: Heart is normal size. Minimal calcified plaque over the left anterior descending coronary artery. Mild ectasia of the ascending thoracic aorta measuring 3.3 cm in AP diameter. Right pulmonary arteries are within normal. There is abrupt cutoff of the proximal left main pulmonary artery by the large mass in the left upper lobe likely direct invasion. Mediastinum/Nodes: 2.1 cm right paratracheal lymph node. The large left upper lobe mass extends to the hilum. No right hilar adenopathy. There is a 2.5 x 2.8 cm node over the posterior right neck base. Lungs/Pleura: Lungs are adequately inflated and demonstrate a large bilobed mass versus 2 adjacent masses occupying the left upper lobe with obstruction of the associated central bronchi. The more superior mass measures 7.1 x 7.1 cm and more inferior  anterior mass measures 7.5 x 9.1 cm. Small amount of left pleural fluid. Subcentimeter sub solid nodule over the anterior aspect of the left lower lobe. 4 mm nodule over the posterior right upper lobe with adjacent spiculated 1.1 cm nodule over the posterior right upper lobe. Upper Abdomen: Soft tissue mass versus lymph node over the right inferior pericardial phrenic region measuring 2.1 x 2.6 cm. 2.6 cm mesenteric mass over the left upper quadrant. 3.7 cm mass in the left paraspinal region between the to spine and left kidney. Musculoskeletal: 2.9 cm mass within the right superficial paraspinal muscles at the level of the diaphragm. 3.8 cm soft tissue mass versus abnormal lymph node in the left axillary region. There are a few small masses over the right intercostal regions. These masses and nodes are likely metastatic disease. Minimal degenerate change of the spine. Heterogeneous appearance of the sternum which may be due to metastatic disease. IMPRESSION: Single large bilobed mass versus 2 adjacent masses occupying most of the left upper lobe as the more superior apical mass measures 7.1  x 7.1 cm and more anterior inferior mass measures 7.5 x 9.1 cm extends to the hilum causing abrupt cut off/possible invasion of the left main pulmonary artery and obstruction of the proximal bronchi. 2.1 cm right paratracheal lymph node. Two adjacent right upper lobe nodules with the larger spiculated nodule measuring 1.1 cm. Multiple soft tissue masses over the thorax and abdomen as described compatible with metastatic disease. Mild heterogeneous sclerosis of the sternum suggesting metastatic disease. Findings likely due to primary bronchogenic carcinoma with metastatic disease. Recommend tissue sampling for further evaluation. Mild ectasia of the ascending thoracic aorta measuring 3.3 cm in AP diameter. Recommend annual imaging followup by CTA or MRA. This recommendation follows 2010 ACCF/AHA/AATS/ACR/ASA/SCA/SCAI/SIR/STS/SVM  Guidelines for the Diagnosis and Management of Patients with Thoracic Aortic Disease. Circulation.2010; 121: Y650-P546. Minimal atherosclerotic coronary artery disease. Electronically Signed   By: Marin Olp M.D.   On: 05/23/2017 15:28   Dg Humerus Left  Result Date: 05/19/2017 CLINICAL DATA:  Arm swelling EXAM: LEFT HUMERUS - 2+ VIEW COMPARISON:  None in PACs FINDINGS: The humerus is subjectively adequately mineralized. The observed portions of the elbow and shoulder exhibit no acute abnormalities. There is soft tissue fullness both medially and laterally over the midportion of the arm which may reflect retracted muscle bellies involving the biceps and triceps. IMPRESSION: No acute bony abnormality of the left humerus. Probable retracted muscle bellies following tendinous rupture of the biceps and triceps. Electronically Signed   By: David  Martinique M.D.   On: 05/17/2017 13:56    Procedures Procedures (including critical care time)  Cardiopulmonary Resuscitation (CPR) Procedure Note Directed/Performed by: Baudelio Karnes I personally directed ancillary staff and/or performed CPR in an effort to regain return of spontaneous circulation and to maintain cardiac, neuro and systemic perfusion.    Medications Ordered in ED Medications  potassium chloride 10 MEQ/100ML IVPB (not administered)  EPINEPHrine (ADRENALIN) 1 MG/10ML injection (1 mg Intravenous Given 06/23/17 0012)  sodium bicarbonate injection (50 mEq Intravenous Given 2017/06/23 0008)  magnesium sulfate (IV Push/IM) injection (2 g Intravenous Given 2017-06-23 0002)  calcium chloride injection (1 g Intravenous Given June 23, 2017 0014)  lidocaine (cardiac) 100 mg/75ml (XYLOCAINE) 20 MG/ML injection 2% (100 mg Intravenous Given 2017-06-23 0011)  0.9 %  sodium chloride infusion (1,000 mLs Intravenous New Bag/Given 06-23-2017 0002)     Initial Impression / Assessment and Plan / ED Course  I have reviewed the triage vital signs and the nursing  notes.  Pertinent labs & imaging results that were available during my care of the patient were reviewed by me and considered in my medical decision making (see chart for details).     62 year old male brought in after found unresponsive at the bus stop.  Unknown downtime, however nurses informed us that patient was given a bus pass a few minutes before 10 PM so that he could catch the 10:00 pm bus.  The patient arrived with CPR in progress.  He had received 5 rounds of epinephrine, 450 of amiodarone, 3 rounds of defibrillations and 30 minutes of CPR.  End-tidal CO2 was around 12.  CPR and ACLS continued.  Patient was given multiple rounds of bicarb, calcium, epinephrine and also magnesium and lidocaine without ROSC.  Patient continued to be in V. fib arrest and then went into asystole.  Seth Bell was pronounced deceased at 12:16 AM.  I called patient's daughter, Ms. Seth Bell [phone].  We advised her to come to the hospital so that we can update her  about her father's condition.  Ms. Seth Bell insisted that we tell her the news over the phone, as she lives in [place] with her husband, therefore she was informed over the phone about her father's demise. Ms. Seth Bell will try to come to the hospital in the morning as soon as she has taken care of her domestic obligations.  Final Clinical Impressions(s) / ED Diagnoses   Final diagnoses:  Cardiac arrest [facility])    ED Discharge Orders    None       Varney Biles, MD 07-02-17 [phone]

## 2017-06-15 NOTE — ED Notes (Signed)
Spoke with [place] donor. Pt ruled out and approved to proceed with postmortem care Referral number [id number]/[id number] [facility].

## 2017-06-15 NOTE — ED Notes (Signed)
Per EMS, 5 rounds of Epi given, 450 amiodarone were given in route. Three shocks administered at 200, 300, and 360. 7-o tube placed. And ns bolus given

## 2017-06-15 DEATH — deceased

## 2018-12-06 IMAGING — CT CT CHEST W/ CM
2 of 3 series · 14 of 36 positions shown, 17 images · IV contrast (iopamidol)
Comparison: None.

CLINICAL DATA: Left arm pain and bilateral leg pain for the past
month. Large not on back of left upper arm.

EXAM:
CT CHEST WITH CONTRAST
TECHNIQUE: Multidetector CT imaging of the chest was performed during
intravenous contrast administration.
CONTRAST:  75mL [id number] IOPAMIDOL ([id number]) INJECTION 61%

[Series 2: axial st · axial · 0.79mm/px · z∈[+1553,+1835]mm · 11 of 167 slices shown, 14 images]
[im 13/167  mediastinal]
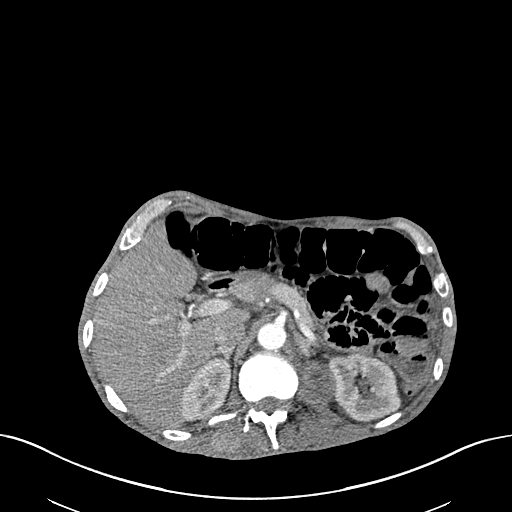
[im 13/167  lung]
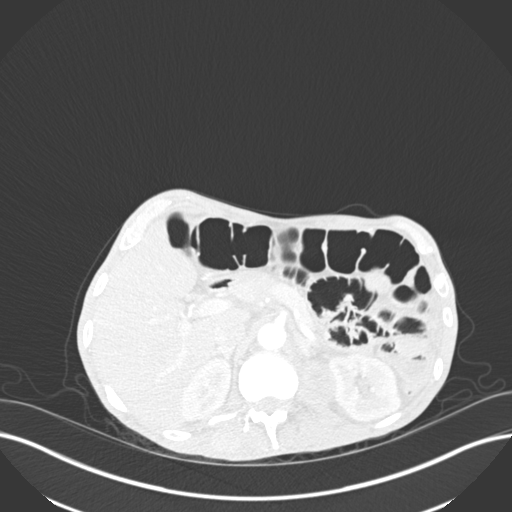
[im 25/167  lung]
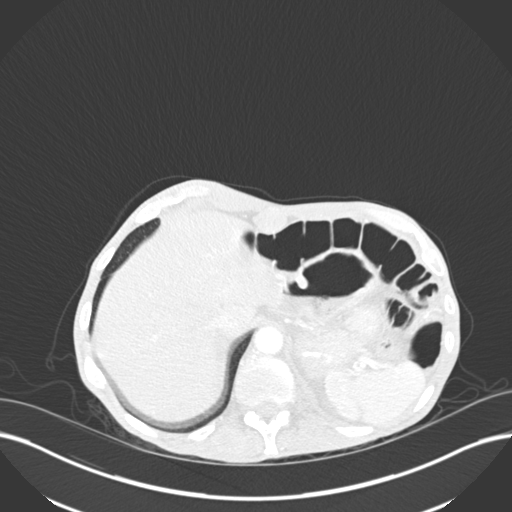
[im 37/167  lung]
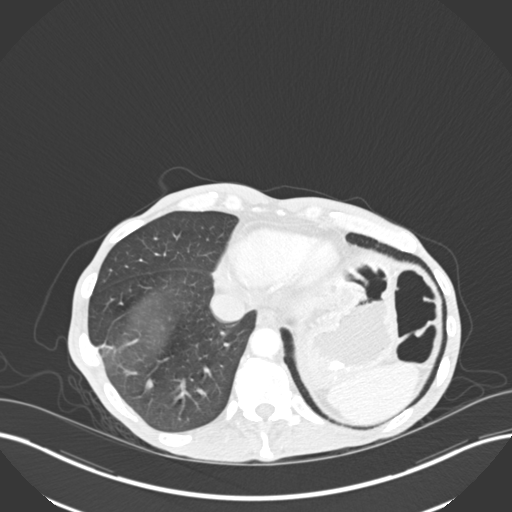
[im 56/167  lung]
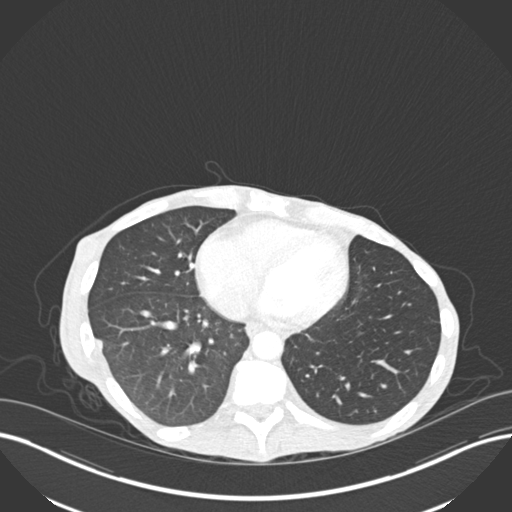
[im 68/167  mediastinal]
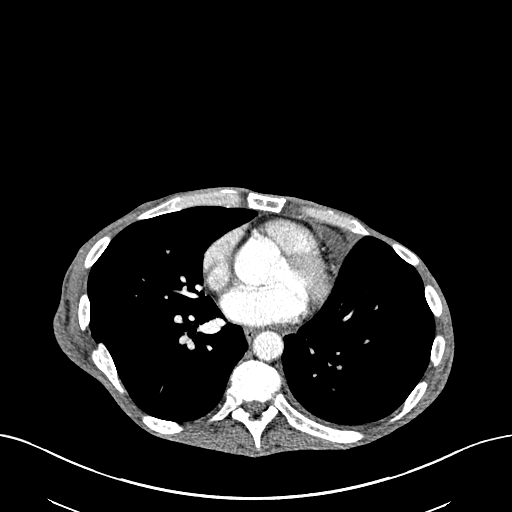
[im 68/167  lung]
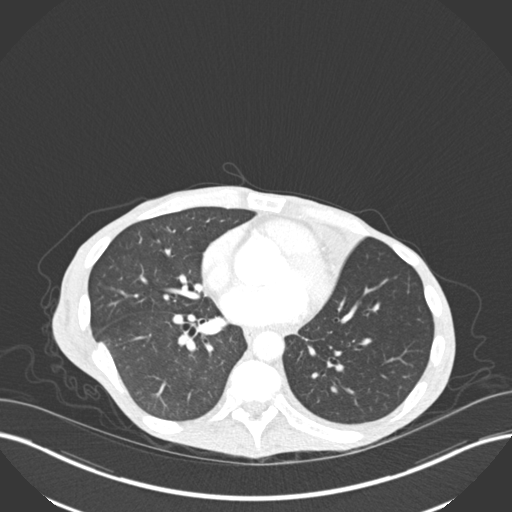
[im 87/167  lung]
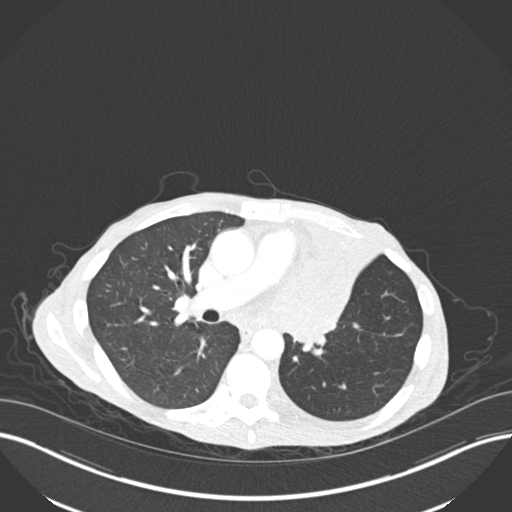
[im 99/167  lung]
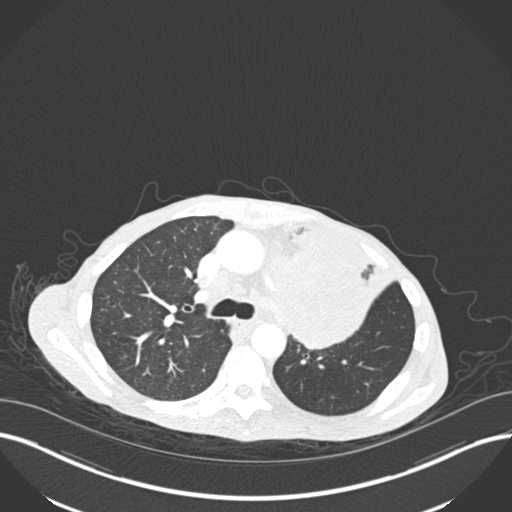
[im 111/167  lung]
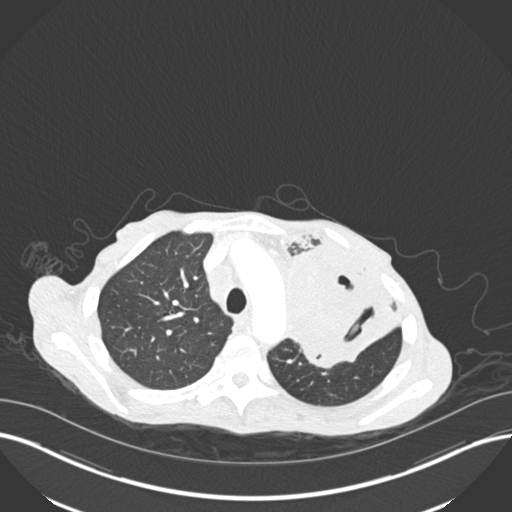
[im 130/167  mediastinal]
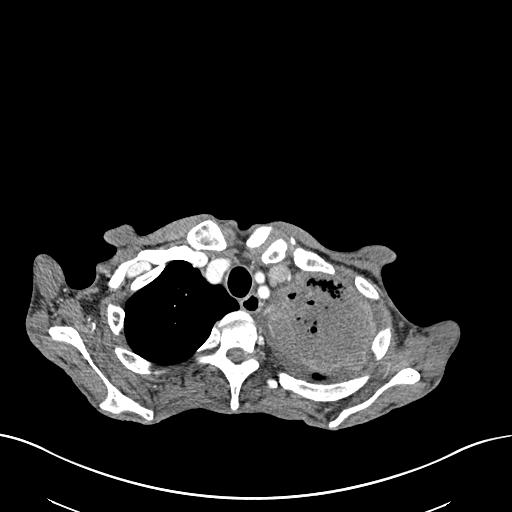
[im 130/167  lung]
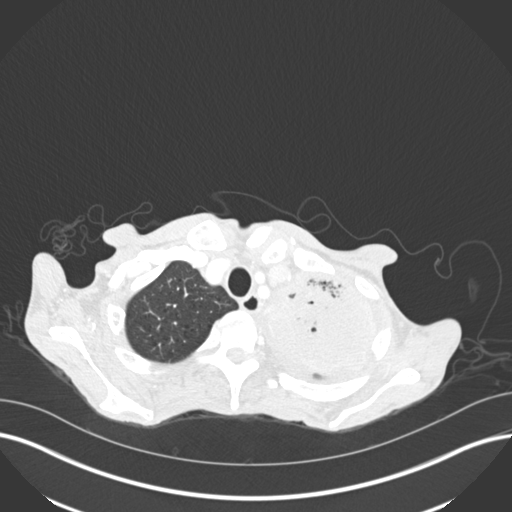
[im 142/167  lung]
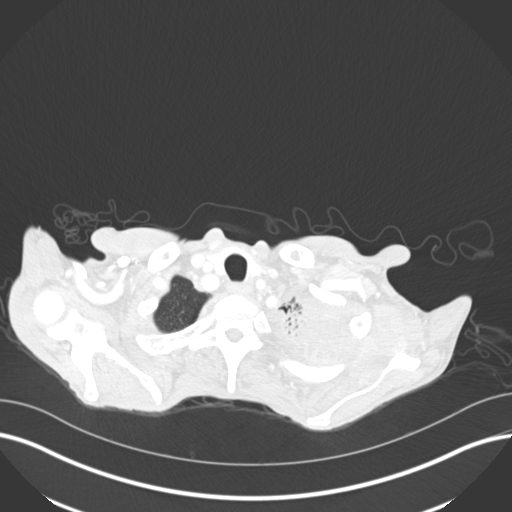
[im 154/167  lung]
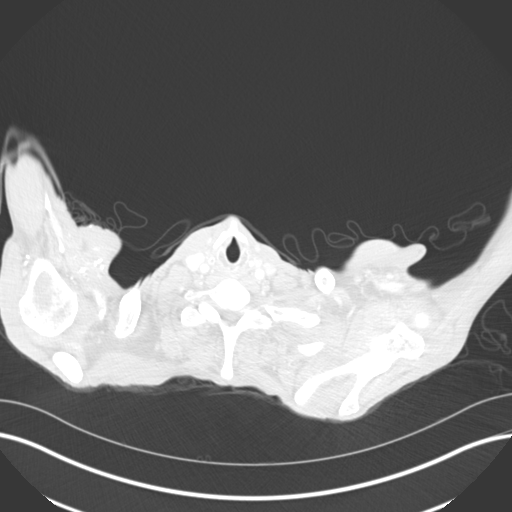

[Series 6: coronal · coronal · 0.65mm/px · 3 of 101 slices shown]
[im 21/101  lung]
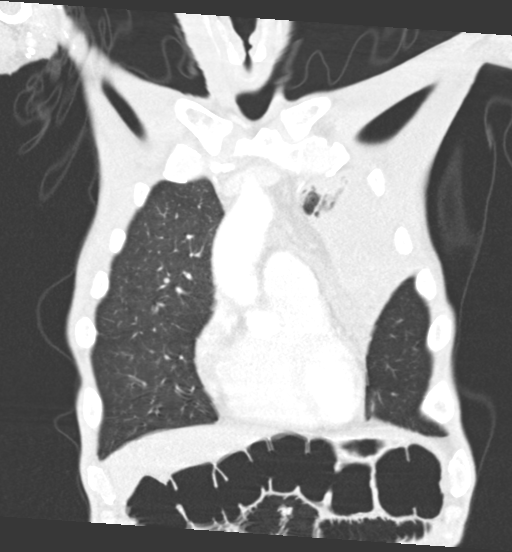
[im 41/101  lung]
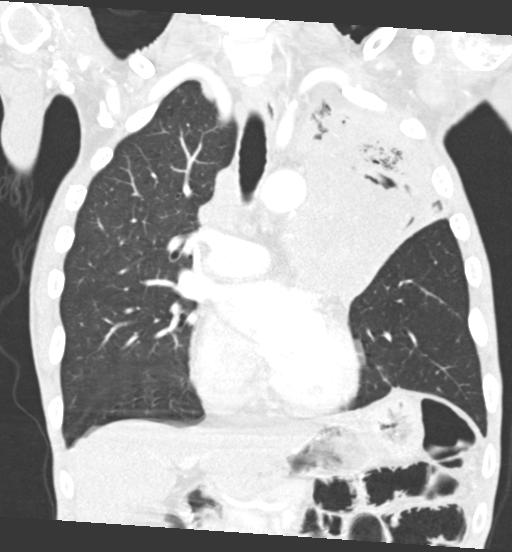
[im 61/101  lung]
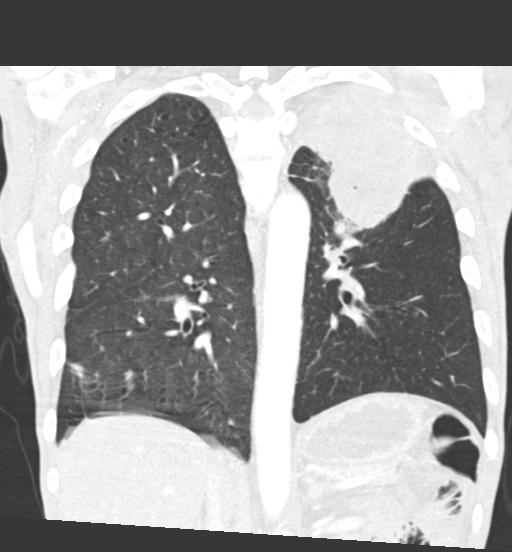

[14 of 36 positions shown; findings below may reference images not displayed]

FINDINGS: Cardiovascular: Heart is normal size. Minimal calcified plaque over
the left anterior descending coronary artery. Mild ectasia of the
ascending thoracic aorta measuring 3.3 cm in AP diameter. Right
pulmonary arteries are within normal. There is abrupt cutoff of the
proximal left main pulmonary artery by the large mass in the left
upper lobe likely direct invasion.

Mediastinum/Nodes: 2.1 cm right paratracheal lymph node. The large
left upper lobe mass extends to the hilum. No right hilar
adenopathy. There is a 2.5 x 2.8 cm node over the posterior right
neck base.

Lungs/Pleura: Lungs are adequately inflated and demonstrate a large
bilobed mass versus 2 adjacent masses occupying the left upper lobe
with obstruction of the associated central bronchi. The more
superior mass measures 7.1 x 7.1 cm and more inferior anterior mass
measures 7.5 x 9.1 cm. Small amount of left pleural fluid.
Subcentimeter sub solid nodule over the anterior aspect of the left
lower lobe. 4 mm nodule over the posterior right upper lobe with
adjacent spiculated 1.1 cm nodule over the posterior right upper
lobe.

Upper Abdomen: Soft tissue mass versus lymph node over the right
inferior pericardial phrenic region measuring 2.1 x 2.6 cm. 2.6 cm
mesenteric mass over the left upper quadrant. 3.7 cm mass in the
left paraspinal region between the to spine and left kidney.

Musculoskeletal: 2.9 cm mass within the right superficial paraspinal
muscles at the level of the diaphragm. 3.8 cm soft tissue mass
versus abnormal lymph node in the left axillary region. There are a
few small masses over the right intercostal regions. These masses
and nodes are likely metastatic disease. Minimal degenerate change
of the spine. Heterogeneous appearance of the sternum which may be
due to metastatic disease.
IMPRESSION: Single large bilobed mass versus 2 adjacent masses occupying most of
the left upper lobe as the more superior apical mass measures 7.1 x
7.1 cm and more anterior inferior mass measures 7.5 x 9.1 cm extends
to the hilum causing abrupt cut off/possible invasion of the left
main pulmonary artery and obstruction of the proximal bronchi.
cm right paratracheal lymph node. Two adjacent right upper lobe
nodules with the larger spiculated nodule measuring 1.1 cm. Multiple
soft tissue masses over the thorax and abdomen as described
compatible with metastatic disease. Mild heterogeneous sclerosis of
the sternum suggesting metastatic disease. Findings likely due to
primary bronchogenic carcinoma with metastatic disease. Recommend
tissue sampling for further evaluation.

Mild ectasia of the ascending thoracic aorta measuring 3.3 cm in AP
diameter. Recommend annual imaging followup by CTA or MRA. This
recommendation follows [id number]
ACCF/AHA/AATS/ACR/ASA/SCA/JIM/YUNG/RTOYOTA/QUIRIJN Guidelines for the
Diagnosis and Management of Patients with Thoracic Aortic Disease.
Circulation.7484; 121: [id number].

Minimal atherosclerotic coronary artery disease.
